# Patient Record
Sex: Female | Born: 1945
Health system: Southern US, Community
[De-identification: ages and names within clinical notes are randomized; demographics above are authoritative.]

## PROBLEM LIST (undated history)

## (undated) DIAGNOSIS — I10 Essential (primary) hypertension: Secondary | ICD-10-CM

## (undated) DIAGNOSIS — H269 Unspecified cataract: Secondary | ICD-10-CM

## (undated) HISTORY — PX: ABDOMINAL HYSTERECTOMY: SHX81

## (undated) HISTORY — DX: Unspecified cataract: H26.9

---

## 2011-12-27 ENCOUNTER — Encounter (HOSPITAL_COMMUNITY): Payer: Self-pay | Admitting: Emergency Medicine

## 2011-12-27 ENCOUNTER — Emergency Department (HOSPITAL_COMMUNITY)
Admission: EM | Admit: 2011-12-27 | Discharge: 2011-12-27 | Disposition: A | Discharge status: Home / Self Care | Payer: Medicare Other | Attending: Emergency Medicine | Admitting: Emergency Medicine

## 2011-12-27 ENCOUNTER — Emergency Department (HOSPITAL_COMMUNITY): Payer: Medicare Other

## 2011-12-27 DIAGNOSIS — R51 Headache: Secondary | ICD-10-CM | POA: Insufficient documentation

## 2011-12-27 DIAGNOSIS — R05 Cough: Secondary | ICD-10-CM | POA: Insufficient documentation

## 2011-12-27 DIAGNOSIS — I10 Essential (primary) hypertension: Secondary | ICD-10-CM | POA: Insufficient documentation

## 2011-12-27 DIAGNOSIS — R059 Cough, unspecified: Secondary | ICD-10-CM | POA: Insufficient documentation

## 2011-12-27 HISTORY — DX: Essential (primary) hypertension: I10

## 2011-12-27 LAB — POCT I-STAT, CHEM 8
BUN: 10 mg/dL (ref 6–23)
Calcium, Ion: 1.12 mmol/L (ref 1.12–1.32)
Chloride: 102 mEq/L (ref 96–112)
Creatinine, Ser: 0.6 mg/dL (ref 0.50–1.10)
Glucose, Bld: 137 mg/dL — ABNORMAL HIGH (ref 70–99)
HCT: 38 % (ref 36.0–46.0)
Hemoglobin: 12.9 g/dL (ref 12.0–15.0)
Potassium: 3.1 mEq/L — ABNORMAL LOW (ref 3.5–5.1)
Sodium: 143 mEq/L (ref 135–145)
TCO2: 31 mmol/L (ref 0–100)

## 2011-12-27 LAB — SEDIMENTATION RATE: Sed Rate: 25 mm/hr — ABNORMAL HIGH (ref 0–22)

## 2011-12-27 NOTE — Discharge Instructions (Signed)
FOLLOW UP WITH YOUR DOCTOR THIS WEEK FOR RECHECK OF BOTH HEADACHE AS WELL AS BLOOD PRESSURE. RETURN HERE WITH ANY WORSENING SYMPTOMS OR NEW CONCERN.   Headache, General, Unknown Cause The specific cause of your headache may not have been found today. There are many causes and types of headache. A few common ones are:  Tension headache.   Migraine.   Infections (examples: dental and sinus infections).   Bone and/or joint problems in the neck or jaw.   Depression.   Eye problems.  These headaches are not life threatening.  Headaches can sometimes be diagnosed by a patient history and a physical exam. Sometimes, lab and imaging studies (such as x-ray and/or CT scan) are used to rule out more serious problems. In some cases, a spinal tap (lumbar puncture) may be requested. There are many times when your exam and tests may be normal on the first visit even when there is a serious problem causing your headaches. Because of that, it is very important to follow up with your doctor or local clinic for further evaluation. FINDING OUT THE RESULTS OF TESTS  If a radiology test was performed, a radiologist will review your results.   You will be contacted by the emergency department or your physician if any test results require a change in your treatment plan.   Not all test results may be available during your visit. If your test results are not back during the visit, make an appointment with your caregiver to find out the results. Do not assume everything is normal if you have not heard from your caregiver or the medical facility. It is important for you to follow up on all of your test results.  HOME CARE INSTRUCTIONS   Keep follow-up appointments with your caregiver, or any specialist referral.   Only take over-the-counter or prescription medicines for pain, discomfort, or fever as directed by your caregiver.   Biofeedback, massage, or other relaxation techniques may be helpful.   Ice packs  or heat applied to the head and neck can be used. Do this three to four times per day, or as needed.   Call your doctor if you have any questions or concerns.   If you smoke, you should quit.  SEEK MEDICAL CARE IF:   You develop problems with medications prescribed.   You do not respond to or obtain relief from medications.   You have a change from the usual headache.   You develop nausea or vomiting.  SEEK IMMEDIATE MEDICAL CARE IF:   If your headache becomes severe.   You have an unexplained oral temperature above 102 F (38.9 C), or as your caregiver suggests.   You have a stiff neck.   You have loss of vision.   You have muscular weakness.   You have loss of muscular control.   You develop severe symptoms different from your first symptoms.   You start losing your balance or have trouble walking.   You feel faint or pass out.  MAKE SURE YOU:   Understand these instructions.   Will watch your condition.   Will get help right away if you are not doing well or get worse.  Document Released: 10/05/2005 Document Revised: 09/24/2011 Document Reviewed: 05/24/2008 [facility] Patient Information 2012 [place], [place].

## 2011-12-27 NOTE — ED Notes (Signed)
Pt in CT scan.

## 2011-12-27 NOTE — ED Notes (Signed)
Heart Healthy Tray Ordered  

## 2011-12-27 NOTE — ED Provider Notes (Signed)
Medical screening examination/treatment/procedure(s) were conducted as a shared visit with non-physician practitioner(s) and myself.  I personally evaluated the patient during the encounter  Joellyn Grandt, MD 12/27/11 1943 

## 2011-12-27 NOTE — ED Notes (Signed)
PT. REPORTS HEADACHE ONSET LAST NIGHT , ALSO REPORTS OCCASIONAL PRODUCTIVE COUGH FOR 1 WEEK.

## 2011-12-27 NOTE — ED Provider Notes (Signed)
History     CSN: [id number]  Arrival date & time 12/27/11  9629   First MD Initiated Contact with Patient 12/27/11 [phone]      Chief Complaint  Patient presents with  . Headache    (Consider location/radiation/quality/duration/timing/severity/associated sxs/prior treatment) HPI Complains of frontal headache sharp and dull in nature gradual in onset, intermittent 2 nights ago pain lasts 10 minutes at a time nonradiating treated with Tylenol with partial relief. Has never had similar symptoms in the past. No other associated symptoms no visual change no fever. Patient reports his had slight cough for one week which is improving with time. Patient pain-free at present Past Medical History  Diagnosis Date  . Hypertension     Past Surgical History  Procedure Date  . Abdominal hysterectomy     No family history on file.  History  Substance Use Topics  . Smoking status: Never Smoker   . Smokeless tobacco: Not on file  . Alcohol Use: No    OB History    Grav Para Term Preterm Abortions TAB SAB Ect Mult Living                  Review of Systems  Constitutional: Negative.   Respiratory: Positive for cough.   Cardiovascular: Negative.   Gastrointestinal: Negative.   Musculoskeletal: Negative.   Skin: Negative.   Neurological: Positive for headaches.  Hematological: Negative.   Psychiatric/Behavioral: Negative.   All other systems reviewed and are negative.    Allergies  Review of patient's allergies indicates no known allergies.  Home Medications   Current Outpatient Rx  Name Route Sig Dispense Refill  . ADULT MULTIVITAMIN W/MINERALS CH Oral Take 1 tablet by mouth daily.      BP 181/90  Pulse 74  Temp(Src) 98.3 F (36.8 C) (Oral)  Resp 20  SpO2 96%  Physical Exam  Nursing note and vitals reviewed. Constitutional: She appears well-developed and well-nourished.  HENT:  Head: Normocephalic and atraumatic.  Eyes: Conjunctivae are normal. Pupils are equal,  round, and reactive to light.       Fundi benign  Neck: Neck supple. No tracheal deviation present. No thyromegaly present.  Cardiovascular: Normal rate and regular rhythm.   No murmur heard. Pulmonary/Chest: Effort normal and breath sounds normal.  Abdominal: Soft. Bowel sounds are normal. She exhibits no distension. There is no tenderness.  Musculoskeletal: Normal range of motion. She exhibits no edema and no tenderness.  Neurological: She is alert. She has normal reflexes. Coordination normal.  Skin: Skin is warm and dry. No rash noted.  Psychiatric: She has a normal mood and affect. Her behavior is normal. Thought content normal.    ED Course  Procedures (including critical care time)  Labs Reviewed - No data to display No results found.   No diagnosis found. Declines pain medicine   MDM  Pretest probability for temporal arteritis is low He consented CDU pending laboratory results Diagnosis headache        Doug Sou, MD 12/27/11 [phone]

## 2011-12-27 NOTE — ED Provider Notes (Signed)
She has been pain free while waiting CT and lab results. Currently eating breakfast. CT negative, Sed Rate WNL. Discussed findings with patient and husband, all questions answered. Plan to discharge home with PCP follow up.  Rodena Medin, PA-C 12/27/11 1023

## 2013-03-23 IMAGING — CT CT HEAD W/O CM
1 of 2 series · 16 of 30 positions shown, 20 images · non-contrast
Comparison: None.

CLINICAL DATA: Headache.

CT HEAD WITHOUT CONTRAST
TECHNIQUE: Contiguous axial images were obtained from the base of
the skull through the vertex without contrast.

[Series 3: recon 2: brain · axial · 0.47mm/px · z∈[+110,+251]mm · 16 of 64 slices shown, 20 images]
[im 4/64  brain]
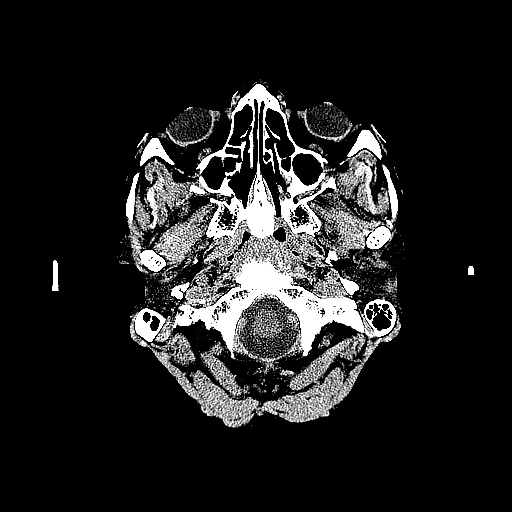
[im 4/64  bone]
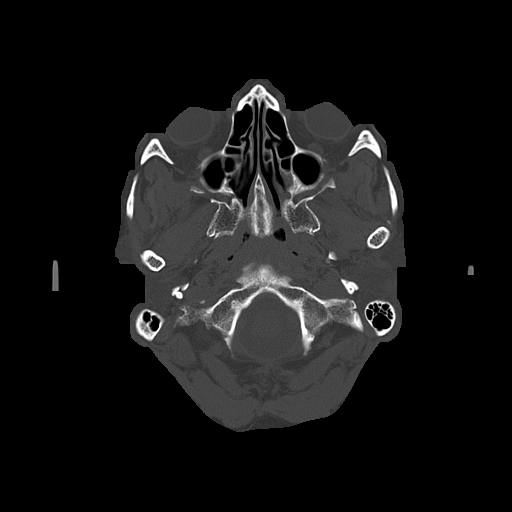
[im 7/64  brain]
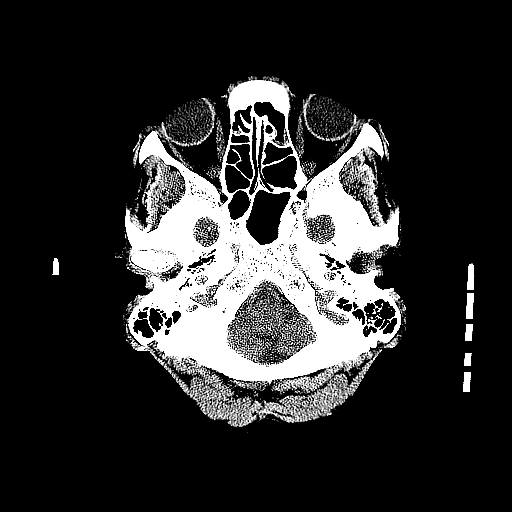
[im 10/64  brain]
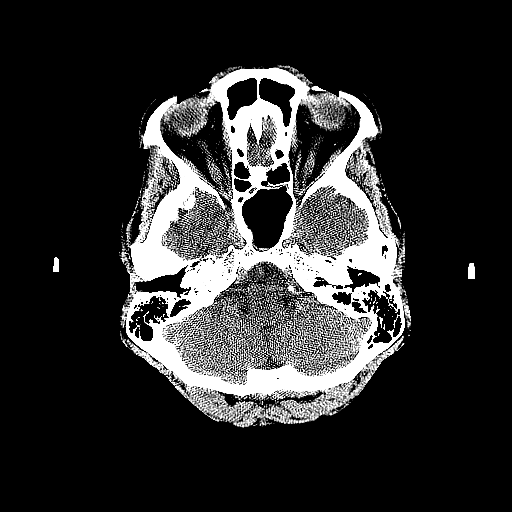
[im 14/64  brain]
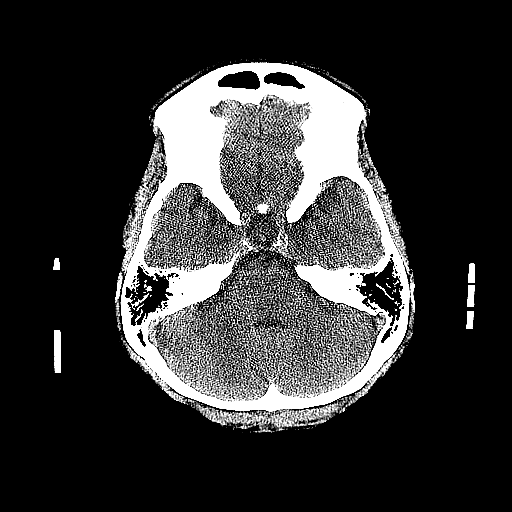
[im 20/64  brain]
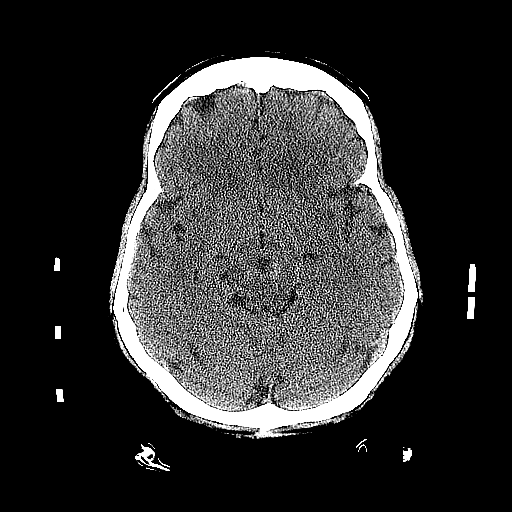
[im 20/64  bone]
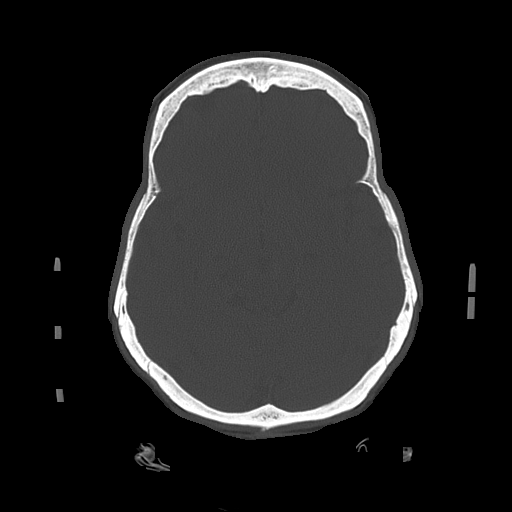
[im 24/64  brain]
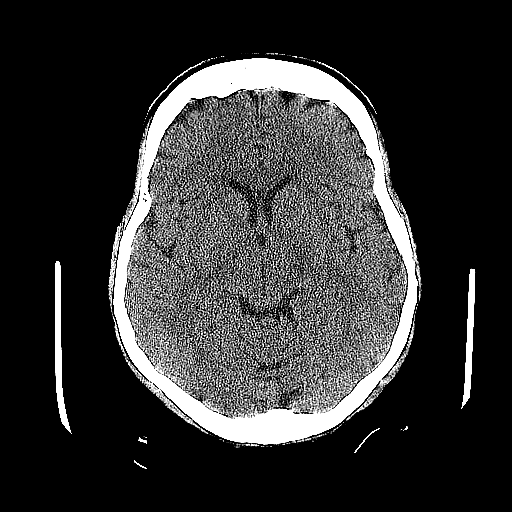
[im 27/64  brain]
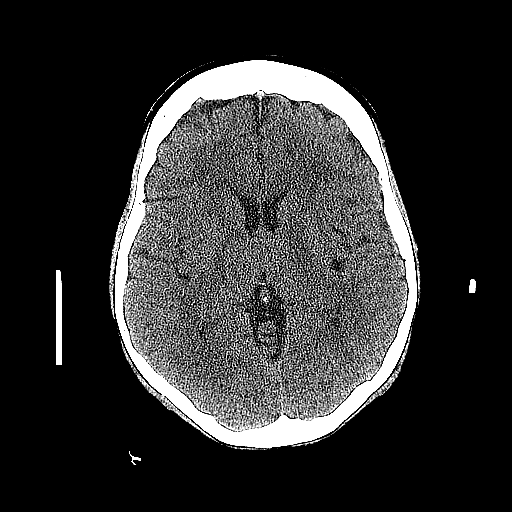
[im 30/64  brain]
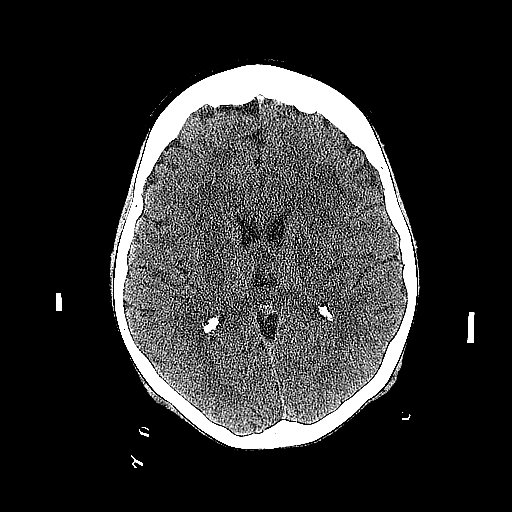
[im 34/64  brain]
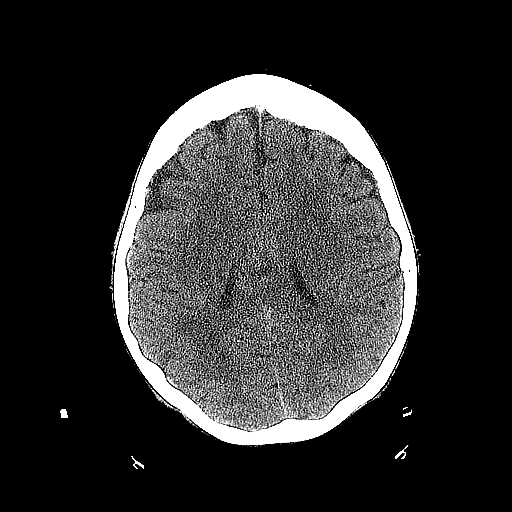
[im 34/64  bone]
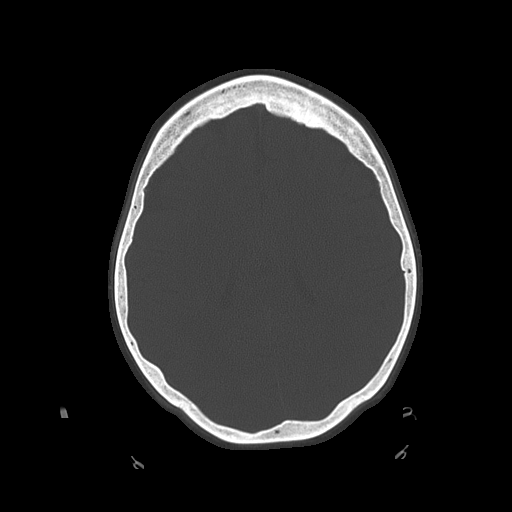
[im 37/64  brain]
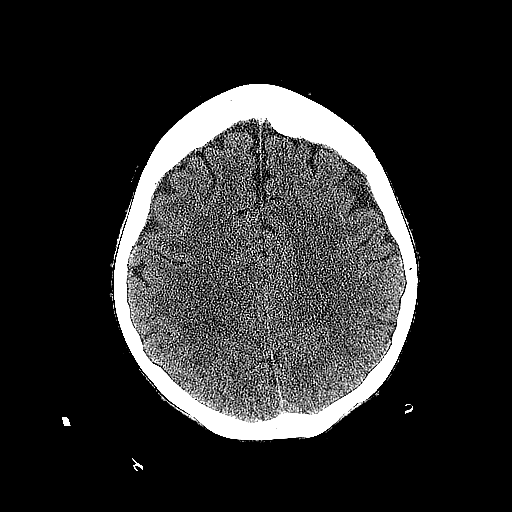
[im 40/64  brain]
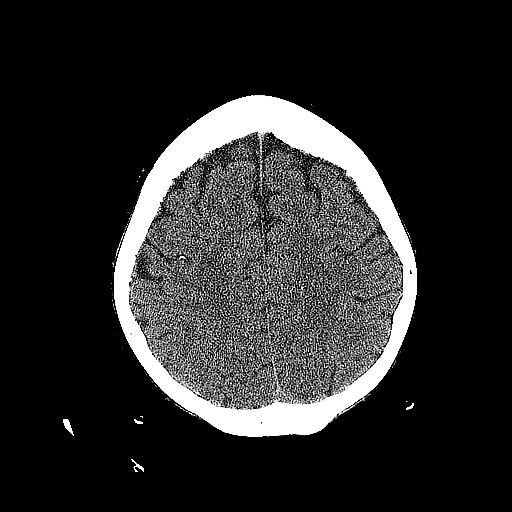
[im 44/64  brain]
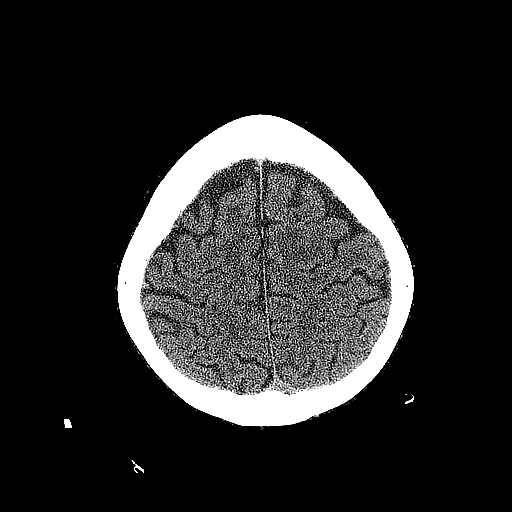
[im 50/64  brain]
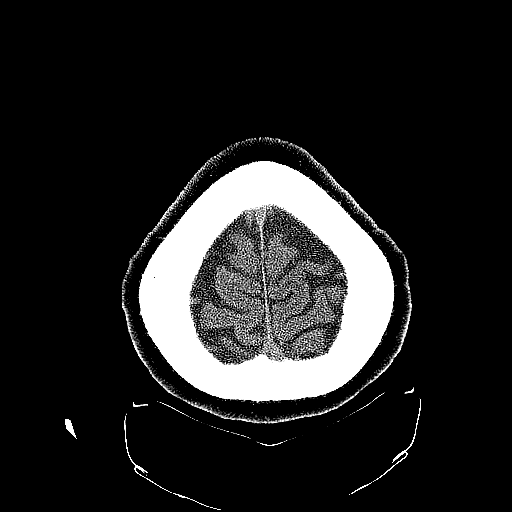
[im 50/64  bone]
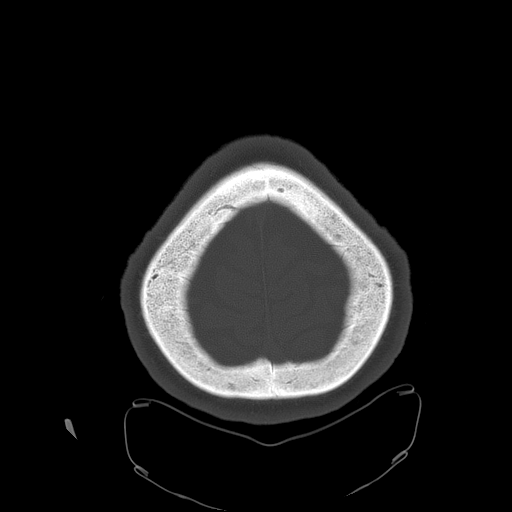
[im 54/64  brain]
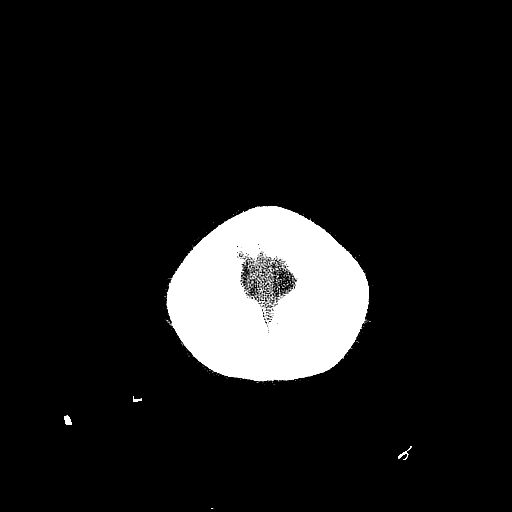
[im 57/64  brain]
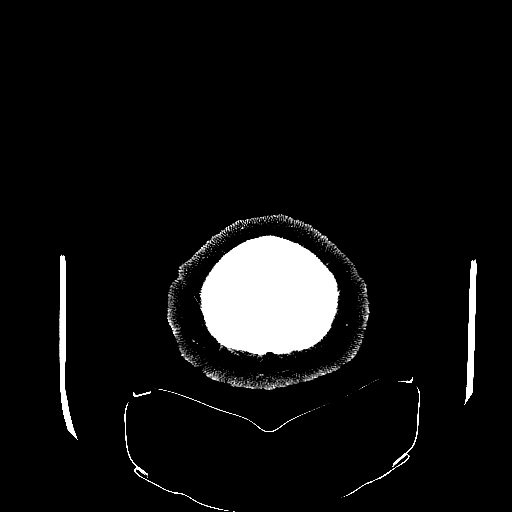
[im 60/64  brain]
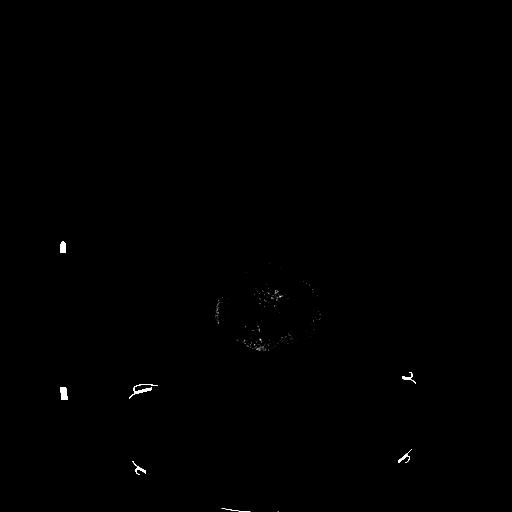

[16 of 30 positions shown; findings below may reference images not displayed]

FINDINGS: The brain demonstrates no evidence of hemorrhage,
infarction, edema, mass effect, extra-axial fluid collection,
hydrocephalus or mass lesion.  The skull is unremarkable.
Symmetric changes in the periventricular white matter are
consistent with small vessel disease.
IMPRESSION: Small vessel disease.  No acute findings.

## 2018-12-06 DIAGNOSIS — H35033 Hypertensive retinopathy, bilateral: Secondary | ICD-10-CM | POA: Diagnosis not present

## 2018-12-06 DIAGNOSIS — H2513 Age-related nuclear cataract, bilateral: Secondary | ICD-10-CM | POA: Diagnosis not present

## 2019-01-04 DIAGNOSIS — H5703 Miosis: Secondary | ICD-10-CM | POA: Diagnosis not present

## 2019-01-04 DIAGNOSIS — H35033 Hypertensive retinopathy, bilateral: Secondary | ICD-10-CM | POA: Diagnosis not present

## 2019-01-04 DIAGNOSIS — H2512 Age-related nuclear cataract, left eye: Secondary | ICD-10-CM | POA: Diagnosis not present

## 2019-01-04 DIAGNOSIS — H2513 Age-related nuclear cataract, bilateral: Secondary | ICD-10-CM | POA: Diagnosis not present

## 2019-01-04 DIAGNOSIS — H25013 Cortical age-related cataract, bilateral: Secondary | ICD-10-CM | POA: Diagnosis not present

## 2019-01-04 DIAGNOSIS — H25012 Cortical age-related cataract, left eye: Secondary | ICD-10-CM | POA: Diagnosis not present

## 2019-03-01 ENCOUNTER — Other Ambulatory Visit: Payer: Self-pay

## 2019-03-01 ENCOUNTER — Ambulatory Visit (INDEPENDENT_AMBULATORY_CARE_PROVIDER_SITE_OTHER): Payer: Medicare Other | Admitting: Family Medicine

## 2019-03-01 ENCOUNTER — Encounter: Payer: Self-pay | Admitting: Family Medicine

## 2019-03-01 DIAGNOSIS — Z114 Encounter for screening for human immunodeficiency virus [HIV]: Secondary | ICD-10-CM

## 2019-03-01 DIAGNOSIS — H259 Unspecified age-related cataract: Secondary | ICD-10-CM | POA: Diagnosis not present

## 2019-03-01 DIAGNOSIS — Z1159 Encounter for screening for other viral diseases: Secondary | ICD-10-CM

## 2019-03-01 DIAGNOSIS — H269 Unspecified cataract: Secondary | ICD-10-CM | POA: Insufficient documentation

## 2019-03-01 DIAGNOSIS — I10 Essential (primary) hypertension: Secondary | ICD-10-CM

## 2019-03-01 MED ORDER — VALSARTAN-HYDROCHLOROTHIAZIDE 80-12.5 MG PO TABS: 1.0000 | ORAL_TABLET | Freq: Every day | ORAL | 1 refills | Status: DC

## 2019-03-01 NOTE — Patient Instructions (Signed)
DASH Eating Plan  DASH stands for "Dietary Approaches to Stop Hypertension." The DASH eating plan is a healthy eating plan that has been shown to reduce high blood pressure (hypertension). It may also reduce your risk for type 2 diabetes, heart disease, and stroke. The DASH eating plan may also help with weight loss.  What are tips for following this plan?    General guidelines   Avoid eating more than 2,300 mg (milligrams) of salt (sodium) a day. If you have hypertension, you may need to reduce your sodium intake to 1,500 mg a day.   Limit alcohol intake to no more than 1 drink a day for nonpregnant women and 2 drinks a day for men. One drink equals 12 oz of beer, 5 oz of wine, or 1 oz of hard liquor.   Work with your health care provider to maintain a healthy body weight or to lose weight. Ask what an ideal weight is for you.   Get at least 30 minutes of exercise that causes your heart to beat faster (aerobic exercise) most days of the week. Activities may include walking, swimming, or biking.   Work with your health care provider or diet and nutrition specialist (dietitian) to adjust your eating plan to your individual calorie needs.  Reading food labels     Check food labels for the amount of sodium per serving. Choose foods with less than 5 percent of the Daily Value of sodium. Generally, foods with less than 300 mg of sodium per serving fit into this eating plan.   To find whole grains, look for the word "whole" as the first word in the ingredient list.  Shopping   Buy products labeled as "low-sodium" or "no salt added."   Buy fresh foods. Avoid canned foods and premade or frozen meals.  Cooking   Avoid adding salt when cooking. Use salt-free seasonings or herbs instead of table salt or sea salt. Check with your health care provider or pharmacist before using salt substitutes.   Do not fry foods. Cook foods using healthy methods such as baking, boiling, grilling, and broiling instead.   Cook with  heart-healthy oils, such as olive, canola, soybean, or sunflower oil.  Meal planning   Eat a balanced diet that includes:  ? 5 or more servings of fruits and vegetables each day. At each meal, try to fill half of your plate with fruits and vegetables.  ? Up to 6-8 servings of whole grains each day.  ? Less than 6 oz of lean meat, poultry, or fish each day. A 3-oz serving of meat is about the same size as a deck of cards. One egg equals 1 oz.  ? 2 servings of low-fat dairy each day.  ? A serving of nuts, seeds, or beans 5 times each week.  ? Heart-healthy fats. Healthy fats called Omega-3 fatty acids are found in foods such as flaxseeds and coldwater fish, like sardines, salmon, and mackerel.   Limit how much you eat of the following:  ? Canned or prepackaged foods.  ? Food that is high in trans fat, such as fried foods.  ? Food that is high in saturated fat, such as fatty meat.  ? Sweets, desserts, sugary drinks, and other foods with added sugar.  ? Full-fat dairy products.   Do not salt foods before eating.   Try to eat at least 2 vegetarian meals each week.   Eat more home-cooked food and less restaurant, buffet, and fast food.     When eating at a restaurant, ask that your food be prepared with less salt or no salt, if possible.  What foods are recommended?  The items listed may not be a complete list. Talk with your dietitian about what dietary choices are best for you.  Grains  Whole-grain or whole-wheat bread. Whole-grain or whole-wheat pasta. Brown rice. Oatmeal. Quinoa. Bulgur. Whole-grain and low-sodium cereals. Pita bread. Low-fat, low-sodium crackers. Whole-wheat flour tortillas.  Vegetables  Fresh or frozen vegetables (raw, steamed, roasted, or grilled). Low-sodium or reduced-sodium tomato and vegetable juice. Low-sodium or reduced-sodium tomato sauce and tomato paste. Low-sodium or reduced-sodium canned vegetables.  Fruits  All fresh, dried, or frozen fruit. Canned fruit in natural juice (without  added sugar).  Meat and other protein foods  Skinless chicken or turkey. Ground chicken or turkey. Pork with fat trimmed off. Fish and seafood. Egg whites. Dried beans, peas, or lentils. Unsalted nuts, nut butters, and seeds. Unsalted canned beans. Lean cuts of beef with fat trimmed off. Low-sodium, lean deli meat.  Dairy  Low-fat (1%) or fat-free (skim) milk. Fat-free, low-fat, or reduced-fat cheeses. Nonfat, low-sodium ricotta or cottage cheese. Low-fat or nonfat yogurt. Low-fat, low-sodium cheese.  Fats and oils  Soft margarine without trans fats. Vegetable oil. Low-fat, reduced-fat, or light mayonnaise and salad dressings (reduced-sodium). Canola, safflower, olive, soybean, and sunflower oils. Avocado.  Seasoning and other foods  Herbs. Spices. Seasoning mixes without salt. Unsalted popcorn and pretzels. Fat-free sweets.  What foods are not recommended?  The items listed may not be a complete list. Talk with your dietitian about what dietary choices are best for you.  Grains  Baked goods made with fat, such as croissants, muffins, or some breads. Dry pasta or rice meal packs.  Vegetables  Creamed or fried vegetables. Vegetables in a cheese sauce. Regular canned vegetables (not low-sodium or reduced-sodium). Regular canned tomato sauce and paste (not low-sodium or reduced-sodium). Regular tomato and vegetable juice (not low-sodium or reduced-sodium). Pickles. Olives.  Fruits  Canned fruit in a light or heavy syrup. Fried fruit. Fruit in cream or butter sauce.  Meat and other protein foods  Fatty cuts of meat. Ribs. Fried meat. Bacon. Sausage. Bologna and other processed lunch meats. Salami. Fatback. Hotdogs. Bratwurst. Salted nuts and seeds. Canned beans with added salt. Canned or smoked fish. Whole eggs or egg yolks. Chicken or turkey with skin.  Dairy  Whole or 2% milk, cream, and half-and-half. Whole or full-fat cream cheese. Whole-fat or sweetened yogurt. Full-fat cheese. Nondairy creamers. Whipped toppings.  Processed cheese and cheese spreads.  Fats and oils  Butter. Stick margarine. Lard. Shortening. Ghee. Bacon fat. Tropical oils, such as coconut, palm kernel, or palm oil.  Seasoning and other foods  Salted popcorn and pretzels. Onion salt, garlic salt, seasoned salt, table salt, and sea salt. Worcestershire sauce. Tartar sauce. Barbecue sauce. Teriyaki sauce. Soy sauce, including reduced-sodium. Steak sauce. Canned and packaged gravies. Fish sauce. Oyster sauce. Cocktail sauce. Horseradish that you find on the shelf. Ketchup. Mustard. Meat flavorings and tenderizers. Bouillon cubes. Hot sauce and Tabasco sauce. Premade or packaged marinades. Premade or packaged taco seasonings. Relishes. Regular salad dressings.  Where to find more information:   National Heart, Lung, and Blood Institute: www.nhlbi.nih.gov   American Heart Association: www.heart.org  Summary   The DASH eating plan is a healthy eating plan that has been shown to reduce high blood pressure (hypertension). It may also reduce your risk for type 2 diabetes, heart disease, and stroke.   With the   DASH eating plan, you should limit salt (sodium) intake to 2,300 mg a day. If you have hypertension, you may need to reduce your sodium intake to 1,500 mg a day.   When on the DASH eating plan, aim to eat more fresh fruits and vegetables, whole grains, lean proteins, low-fat dairy, and heart-healthy fats.   Work with your health care provider or diet and nutrition specialist (dietitian) to adjust your eating plan to your individual calorie needs.  This information is not intended to replace advice given to you by your health care provider. Make sure you discuss any questions you have with your health care provider.  Document Released: 09/24/2011 Document Revised: 09/28/2016 Document Reviewed: 09/28/2016  Elsevier Interactive Patient Education  2019 [employer]

## 2019-03-01 NOTE — Progress Notes (Signed)
Name: Jeanne Hickman   MRN: [id number]    DOB: 02/12/1946   Date:03/01/2019       Progress Note  Subjective  Chief Complaint  Chief Complaint  Patient presents with  . Establish Care  . Hypertension    elevated BP counld not have cataract surgery    I connected with  Jeanne Hickman  on 03/01/19 at  7:40 AM EDT by a video enabled telemedicine application and verified that I am speaking with the correct person using two identifiers.  I discussed the limitations of evaluation and management by telemedicine and the availability of in person appointments. The patient expressed understanding and agreed to proceed. Staff also discussed with the patient that there may be a patient responsible charge related to this service. Patient Location: Home Provider Location: Home Additional Individuals present: None  HPI  She presents to establish care and for the following:  Social: Lives with her husband; has two sons in [place].    HTN: She was told yesterday that she was unable to have her cataract surgery. 239/110 initially, then went down to 220, then 211.  - She used to be prescribed valsartan-HCTZ, but ran out about 2 years ago when her PCP retired.  She used to see Jeanne Hickman. -  no TIAs, no chest pain on exertion, no dyspnea on exertion, no swelling of ankles, headches, vision changes, confusion, slurred speech, or weakness. - DASH diet discussed - pt does follow a low sodium diet; salt not added to cooking and salt shaker not on table  Cataracts LEFT: Jeanne Hickman was supposed to remove this yesterday, but patient was too hypertensive. She does wear glasses all the time.  There are no active problems to display for this patient.   Past Surgical History:  Procedure Laterality Date  . ABDOMINAL HYSTERECTOMY      No family history on file.  Social History   Socioeconomic History  . Marital status: Married    Spouse name: Jeanne Hickman  . Number of children:  2  . Years of education: Not on file  . Highest education level: Not on file  Occupational History  . Occupation: retired  Scientific laboratory technician  . Financial resource strain: Not hard at all  . Food insecurity:    Worry: Never true    Inability: Never true  . Transportation needs:    Medical: No    Non-medical: No  Tobacco Use  . Smoking status: Former Smoker    Types: Cigarettes    Last attempt to quit: 02/28/1978    Years since quitting: 41.0  . Smokeless tobacco: Never Used  Substance and Sexual Activity  . Alcohol use: No  . Drug use: No  . Sexual activity: Yes    Partners: Male  Lifestyle  . Physical activity:    Days per week: 2 days    Minutes per session: 60 min  . Stress: Not at all  Relationships  . Social connections:    Talks on phone: More than three times a week    Gets together: More than three times a week    Attends religious service: 1 to 4 times per year    Active member of club or organization: No    Attends meetings of clubs or organizations: Never    Relationship status: Married  . Intimate partner violence:    Fear of current or ex partner: No    Emotionally abused: No    Physically abused: No  Forced sexual activity: No  Other Topics Concern  . Not on file  Social History Narrative   Last seen JeanneEason 2 years ago     Current Outpatient Medications:  Marland Kitchen  Multiple Vitamin (MULITIVITAMIN WITH MINERALS) TABS, Take 1 tablet by mouth daily., Disp: , Rfl:  .  valsartan-hydrochlorothiazide (DIOVAN-HCT) 160-25 MG tablet, Take 1 tablet by mouth daily., Disp: , Rfl:   No Known Allergies  I personally reviewed active problem list, medication list, allergies, health maintenance, notes from last encounter, lab results with the patient/caregiver today.   ROS Constitutional: Negative for fever or weight change.  Respiratory: Negative for cough and shortness of breath.   Cardiovascular: Negative for chest pain or palpitations.  Gastrointestinal: Negative  for abdominal pain, no bowel changes.  Musculoskeletal: Negative for gait problem or joint swelling.  Skin: Negative for rash.  Neurological: Negative for dizziness or headache.  No other specific complaints in a complete review of systems (except as listed in HPI above).  Objective  Virtual encounter, vitals not obtained.  There is no height or weight on file to calculate BMI.  Physical Exam Constitutional: Patient appears well-developed and well-nourished. No distress.  HENT: Head: Normocephalic and atraumatic.  Neck: Normal range of motion. Pulmonary/Chest: Effort normal. No respiratory distress. Speaking in complete sentences Neurological: Pt is alert and oriented to person, place, and time. Coordination, speech and gait are normal.  Psychiatric: Patient has a very pleasant mood and affect. behavior is normal. Judgment and thought content normal.  No results found for this or any previous visit (from the past 72 hour(s)).  PHQ2/9: Depression screen PHQ 2/9 03/01/2019  Decreased Interest 0  Down, Depressed, Hopeless 0  PHQ - 2 Score 0  Altered sleeping 0  Tired, decreased energy 0  Change in appetite 0  Feeling bad or failure about yourself  0  Trouble concentrating 0  Moving slowly or fidgety/restless 0  Suicidal thoughts 0  PHQ-9 Score 0  Difficult doing work/chores Not difficult at all   PHQ-2/9 Result is negative.    Fall Risk: Fall Risk  03/01/2019  Falls in the past year? 0  Number falls in past yr: 0  Injury with Fall? 0  Follow up Falls evaluation completed    Assessment & Plan  1. Essential hypertension - Has likely been poorly controlled for about 2 years, we will restart her previous medication at 1/2 dose for 2 days, then plan to increase to full dose with close monitoring until under control.  Will obtain labs at f/u in 2 days to ensure proper kidney function and to ensure no other underlying cause is suspected (ie adrenal adenoma, hyperthyroid, kidney  disease) - valsartan-hydrochlorothiazide (DIOVAN HCT) 80-12.5 MG tablet; Take 1 tablet by mouth daily.  Dispense: 30 tablet; Refill: 1 - COMPLETE METABOLIC PANEL WITH GFR - Lipid panel - TSH - CBC w/Diff/Platelet  2. Senile cataract of left eye, unspecified age-related cataract type - Will work to reduce BP to allow for surgery  3. Encounter for screening for HIV - HIV Antibody (routine testing w rflx)  4. Need for hepatitis C screening test - Hepatitis C antibody  Will address her remaining outstanding health maintenance items at 2 day follow up.    I discussed the assessment and treatment plan with the patient. The patient was provided an opportunity to ask questions and all were answered. The patient agreed with the plan and demonstrated an understanding of the instructions.  The patient was advised to call back  or seek an in-person evaluation if the symptoms worsen or if the condition fails to improve as anticipated.  I provided 22 minutes of non-face-to-face time during this encounter.

## 2019-03-03 ENCOUNTER — Encounter: Payer: Self-pay | Admitting: Family Medicine

## 2019-03-03 ENCOUNTER — Ambulatory Visit (INDEPENDENT_AMBULATORY_CARE_PROVIDER_SITE_OTHER): Payer: Medicare Other | Admitting: Family Medicine

## 2019-03-03 ENCOUNTER — Other Ambulatory Visit: Payer: Self-pay

## 2019-03-03 VITALS — BP 210/88 | HR 86 | Temp 98.4°F | Resp 14 | Ht 66.0 in | Wt 174.2 lb

## 2019-03-03 DIAGNOSIS — Z1212 Encounter for screening for malignant neoplasm of rectum: Secondary | ICD-10-CM | POA: Diagnosis not present

## 2019-03-03 DIAGNOSIS — Z1231 Encounter for screening mammogram for malignant neoplasm of breast: Secondary | ICD-10-CM | POA: Diagnosis not present

## 2019-03-03 DIAGNOSIS — Z13820 Encounter for screening for osteoporosis: Secondary | ICD-10-CM | POA: Diagnosis not present

## 2019-03-03 DIAGNOSIS — Z23 Encounter for immunization: Secondary | ICD-10-CM | POA: Diagnosis not present

## 2019-03-03 DIAGNOSIS — I10 Essential (primary) hypertension: Secondary | ICD-10-CM

## 2019-03-03 DIAGNOSIS — Z1211 Encounter for screening for malignant neoplasm of colon: Secondary | ICD-10-CM | POA: Diagnosis not present

## 2019-03-03 DIAGNOSIS — Z114 Encounter for screening for human immunodeficiency virus [HIV]: Secondary | ICD-10-CM | POA: Diagnosis not present

## 2019-03-03 DIAGNOSIS — Z1159 Encounter for screening for other viral diseases: Secondary | ICD-10-CM | POA: Diagnosis not present

## 2019-03-03 MED ORDER — VALSARTAN-HYDROCHLOROTHIAZIDE 80-12.5 MG PO TABS: 2.0000 | ORAL_TABLET | Freq: Every day | ORAL | 1 refills | Status: DC

## 2019-03-03 NOTE — Progress Notes (Signed)
Name: Jeanne Hickman   MRN: [id number]    DOB: 1946-06-01   Date:03/03/2019       Progress Note  Subjective  Chief Complaint  Chief Complaint  Patient presents with  . Hypertension    2 day follow up 196/90 at home 211/92    HPI  HTN: She was told earlier this week that she was unable to have her cataract surgery due to HTN. 239/110 initially.  She brings a log from home over the last few days and BP's have run in the 210's/90's.  She did not take her medication this morning.  We will increase Valsartan HCTZ to 160-25mg  today as she is tolerating the 80/12.5 without issue. -  no TIAs, no chest pain on exertion, no dyspnea on exertion, no swelling of ankles, headches, vision changes, confusion, slurred speech, or weakness. - DASH diet discussed - pt does follow a low sodium diet; salt not added to cooking and salt shaker not on table.  Patient Active Problem List   Diagnosis Date Noted  . Cataract     Social History   Tobacco Use  . Smoking status: Former Smoker    Types: Cigarettes    Last attempt to quit: 02/28/1978    Years since quitting: 41.0  . Smokeless tobacco: Never Used  Substance Use Topics  . Alcohol use: No    Current Outpatient Medications:  Marland Kitchen  Multiple Vitamin (MULITIVITAMIN WITH MINERALS) TABS, Take 1 tablet by mouth daily., Disp: , Rfl:  .  valsartan-hydrochlorothiazide (DIOVAN HCT) 80-12.5 MG tablet, Take 1 tablet by mouth daily., Disp: 30 tablet, Rfl: 1  No Known Allergies  I personally reviewed active problem list, medication list, allergies, health maintenance, notes from last encounter, lab results with the patient/caregiver today.  ROS  Constitutional: Negative for fever or weight change.  Respiratory: Negative for cough and shortness of breath.   Cardiovascular: Negative for chest pain or palpitations.  Gastrointestinal: Negative for abdominal pain, no bowel changes.  Musculoskeletal: Negative for gait problem or joint swelling.  Skin:  Negative for rash.  Neurological: Negative for dizziness or headache.  No other specific complaints in a complete review of systems (except as listed in HPI above).  Objective  Vitals:   03/03/19 0829  BP: (!) 210/94  Pulse: 86  Resp: 14  Temp: 98.4 F (36.9 C)  TempSrc: Oral  SpO2: 95%  Weight: 174 lb 3.2 oz (79 kg)  Height: 5\' 6"  (1.676 m)   Body mass index is 28.12 kg/m.  Nursing Note and Vital Signs reviewed.  Physical Exam  Constitutional: Patient appears well-developed and well-nourished. No distress.  HENT: Head: Normocephalic and atraumatic. Ears: bilateral TMs with no erythema or effusion; Nose: Nose normal. Mouth/Throat: Oropharynx is clear and moist. No oropharyngeal exudate or tonsillar swelling.  Eyes: Conjunctivae and EOM are normal. No scleral icterus.  Pupils are equal, round, and reactive to light.  Neck: Normal range of motion. Neck supple. No JVD present. No thyromegaly present.  Cardiovascular: Normal rate, regular rhythm and normal heart sounds.  No murmur heard. No BLE edema. Pulmonary/Chest: Effort normal and breath sounds normal. No respiratory distress. Musculoskeletal: Normal range of motion, no joint effusions. No gross deformities Neurological: Pt is alert and oriented to person, place, and time. No cranial nerve deficit. Coordination, balance, strength, speech and gait are normal.  Skin: Skin is warm and dry. No rash noted. No erythema.  Psychiatric: Patient has a normal mood and affect. behavior is normal. Judgment and  thought content normal.  No results found for this or any previous visit (from the past 72 hour(s)).  Assessment & Plan  1. Essential hypertension - Labs today ordered at last visit. - valsartan-hydrochlorothiazide (DIOVAN HCT) 80-12.5 MG tablet; Take 2 tablets by mouth daily.  Dispense: 30 tablet; Refill: 1  2. Need for Tdap vaccination - Tdap vaccine greater than or equal to 7yo IM  3. Screening for colorectal cancer -  Ambulatory referral to Gastroenterology  4. Breast cancer screening by mammogram - MM 3D SCREEN BREAST BILATERAL; Future  5. Osteoporosis screening - DG Bone Density; Future  - Will get Pneumonia vaccine on Monday or in 2 weeks.  -Red flags and when to present for emergency care or RTC including fever >101.5F, chest pain, shortness of breath, new/worsening/un-resolving symptoms, reviewed with patient at time of visit. Follow up and care instructions discussed and provided in AVS.

## 2019-03-06 ENCOUNTER — Ambulatory Visit: Payer: Medicare Other

## 2019-03-06 ENCOUNTER — Other Ambulatory Visit: Payer: Self-pay | Admitting: Family Medicine

## 2019-03-06 ENCOUNTER — Other Ambulatory Visit: Payer: Self-pay

## 2019-03-06 ENCOUNTER — Telehealth: Payer: Self-pay | Admitting: Family Medicine

## 2019-03-06 ENCOUNTER — Telehealth: Payer: Self-pay

## 2019-03-06 ENCOUNTER — Other Ambulatory Visit (INDEPENDENT_AMBULATORY_CARE_PROVIDER_SITE_OTHER): Payer: Medicare Other

## 2019-03-06 DIAGNOSIS — R809 Proteinuria, unspecified: Secondary | ICD-10-CM

## 2019-03-06 DIAGNOSIS — I10 Essential (primary) hypertension: Secondary | ICD-10-CM

## 2019-03-06 DIAGNOSIS — E782 Mixed hyperlipidemia: Secondary | ICD-10-CM

## 2019-03-06 DIAGNOSIS — Z23 Encounter for immunization: Secondary | ICD-10-CM | POA: Diagnosis not present

## 2019-03-06 LAB — COMPLETE METABOLIC PANEL WITH GFR
AG Ratio: 1.3 (calc) (ref 1.0–2.5)
ALT: 16 U/L (ref 6–29)
AST: 16 U/L (ref 10–35)
Albumin: 4.2 g/dL (ref 3.6–5.1)
Alkaline phosphatase (APISO): 71 U/L (ref 37–153)
BUN: 23 mg/dL (ref 7–25)
CO2: 32 mmol/L (ref 20–32)
Calcium: 10.3 mg/dL (ref 8.6–10.4)
Chloride: 100 mmol/L (ref 98–110)
Creat: 0.87 mg/dL (ref 0.60–0.93)
GFR, Est African American: 77 mL/min/{1.73_m2} (ref >=60)
GFR, Est Non African American: 67 mL/min/{1.73_m2} (ref >=60)
Globulin: 3.3 g/dL (calc) (ref 1.9–3.7)
Glucose, Bld: 154 mg/dL — ABNORMAL HIGH (ref 65–99)
Potassium: 3.5 mmol/L (ref 3.5–5.3)
Sodium: 141 mmol/L (ref 135–146)
Total Bilirubin: 0.5 mg/dL (ref 0.2–1.2)
Total Protein: 7.5 g/dL (ref 6.1–8.1)

## 2019-03-06 LAB — CBC WITH DIFFERENTIAL/PLATELET
Absolute Monocytes: 502 cells/uL (ref 200–950)
Basophils Absolute: 50 cells/uL (ref 0–200)
Basophils Relative: 0.8 %
Eosinophils Absolute: 62 cells/uL (ref 15–500)
Eosinophils Relative: 1 %
HCT: 39.5 % (ref 35.0–45.0)
Hemoglobin: 12.8 g/dL (ref 11.7–15.5)
Lymphs Abs: 2108 cells/uL (ref 850–3900)
MCH: 26.6 pg — ABNORMAL LOW (ref 27.0–33.0)
MCHC: 32.4 g/dL (ref 32.0–36.0)
MCV: 82.1 fL (ref 80.0–100.0)
MPV: 10.9 fL (ref 7.5–12.5)
Monocytes Relative: 8.1 %
Neutro Abs: 3478 cells/uL (ref 1500–7800)
Neutrophils Relative %: 56.1 %
Platelets: 252 10*3/uL (ref 140–400)
RBC: 4.81 10*6/uL (ref 3.80–5.10)
RDW: 14 % (ref 11.0–15.0)
Total Lymphocyte: 34 %
WBC: 6.2 10*3/uL (ref 3.8–10.8)

## 2019-03-06 LAB — HIV ANTIBODY (ROUTINE TESTING W REFLEX): HIV 1&2 Ab, 4th Generation: NONREACTIVE

## 2019-03-06 LAB — LIPID PANEL
Cholesterol: 240 mg/dL — ABNORMAL HIGH (ref <200)
HDL: 59 mg/dL (ref >=50)
LDL Cholesterol (Calc): 155 mg/dL (calc) — ABNORMAL HIGH
Non-HDL Cholesterol (Calc): 181 mg/dL (calc) — ABNORMAL HIGH (ref <130)
Total CHOL/HDL Ratio: 4.1 (calc) (ref <5.0)
Triglycerides: 133 mg/dL (ref <150)

## 2019-03-06 LAB — TSH: TSH: 1.87 mIU/L (ref 0.40–4.50)

## 2019-03-06 LAB — MICROALBUMIN / CREATININE URINE RATIO
Creatinine, Urine: 102 mg/dL (ref 20–275)
Microalb Creat Ratio: 787 mcg/mg creat — ABNORMAL HIGH (ref <30)
Microalb, Ur: 80.3 mg/dL

## 2019-03-06 LAB — HEPATITIS C ANTIBODY
Hepatitis C Ab: NONREACTIVE
SIGNAL TO CUT-OFF: 0.04 (ref <1.00)

## 2019-03-06 MED ORDER — AMLODIPINE BESYLATE 5 MG PO TABS: 5.0000 mg | ORAL_TABLET | Freq: Every day | ORAL | 1 refills | Status: DC

## 2019-03-06 MED ORDER — ROSUVASTATIN CALCIUM 20 MG PO TABS: 20.0000 mg | ORAL_TABLET | Freq: Every day | ORAL | 3 refills | Status: DC

## 2019-03-06 NOTE — Telephone Encounter (Signed)
Sent in to [facility]. Please apologize for wrong pharmacy

## 2019-03-06 NOTE — Telephone Encounter (Signed)
Patient did car pull up for bp check.  Bp today is still high 212/120.  Consulted with Raquel Sarna NP and will amlodipine 5 mg at night and patient will return Friday for bp check

## 2019-03-06 NOTE — Telephone Encounter (Signed)
Pt.notified

## 2019-03-06 NOTE — Telephone Encounter (Signed)
Copied from [place] [id number]. Topic: Quick Communication - Rx Refill/Question >> Mar 06, 2019 11:57 AM Scherrie Gerlach wrote: Medication: amLODipine (NORVASC) 5 MG tablet rosuvastatin (CRESTOR) 20 MG tablet Pt states her husband is at [facility], but her scripts are not.  They were aent to wrong pharmacy. Can you resend to [facility] [place] [facility] [place] [phone] (Phone) [phone] (Fax)  (she states he is going to do a little shopping, and hopes they can be resent asap, she had appt this am)

## 2019-03-09 ENCOUNTER — Ambulatory Visit (INDEPENDENT_AMBULATORY_CARE_PROVIDER_SITE_OTHER): Payer: Medicare Other

## 2019-03-09 VITALS — BP 169/79 | Ht 66.0 in | Wt 174.0 lb

## 2019-03-09 DIAGNOSIS — Z0000 Encounter for general adult medical examination without abnormal findings: Secondary | ICD-10-CM

## 2019-03-09 NOTE — Patient Instructions (Addendum)
Jeanne Hickman , Thank you for taking time to come for your Medicare Wellness Visit. I appreciate your ongoing commitment to your health goals. Please review the following plan we discussed and let me know if I can assist you in the future.   Screening recommendations/referrals: Colonoscopy: Due, referral sent to GI on 03/03/19. They will contact you for appointment.  Mammogram: Please call [phone] to schedule your mammogram and bone density screening.  Recommended yearly ophthalmology/optometry visit for glaucoma screening and checkup Recommended yearly dental visit for hygiene and checkup  Vaccinations: Influenza vaccine: due during flu season Pneumococcal vaccine: done 03/06/19 Tdap vaccine: done 03/03/19 Shingles vaccine: Shingrix discussed. Please contact your pharmacy for coverage information.   Advanced directives: Advance directive discussed with you today. I have mailed a copy for you to complete at home and have notarized. Once this is complete please bring a copy in to our office so we can scan it into your chart.  Conditions/risks identified: Recommend healthy eating and physical activity for desired weight loss and physical activity.  Next appointment: 03/17/19 8:00 with Raelyn Ensign FNP   Preventive Care 73 Years and Older, Female Preventive care refers to lifestyle choices and visits with your health care provider that can promote health and wellness. What does preventive care include?  A yearly physical exam. This is also called an annual well check.  Dental exams once or twice a year.  Routine eye exams. Ask your health care provider how often you should have your eyes checked.  Personal lifestyle choices, including:  Daily care of your teeth and gums.  Regular physical activity.  Eating a healthy diet.  Avoiding tobacco and drug use.  Limiting alcohol use.  Practicing safe sex.  Taking low-dose aspirin every day.  Taking vitamin and mineral supplements as  recommended by your health care provider. What happens during an annual well check? The services and screenings done by your health care provider during your annual well check will depend on your age, overall health, lifestyle risk factors, and family history of disease. Counseling  Your health care provider may ask you questions about your:  Alcohol use.  Tobacco use.  Drug use.  Emotional well-being.  Home and relationship well-being.  Sexual activity.  Eating habits.  History of falls.  Memory and ability to understand (cognition).  Work and work Statistician.  Reproductive health. Screening  You may have the following tests or measurements:  Height, weight, and BMI.  Blood pressure.  Lipid and cholesterol levels. These may be checked every 5 years, or more frequently if you are over 29 years old.  Skin check.  Lung cancer screening. You may have this screening every year starting at age 73 if you have a 30-pack-year history of smoking and currently smoke or have quit within the past 15 years.  Fecal occult blood test (FOBT) of the stool. You may have this test every year starting at age 73.  Flexible sigmoidoscopy or colonoscopy. You may have a sigmoidoscopy every 5 years or a colonoscopy every 10 years starting at age 73.  Hepatitis C blood test.  Hepatitis B blood test.  Sexually transmitted disease (STD) testing.  Diabetes screening. This is done by checking your blood sugar (glucose) after you have not eaten for a while (fasting). You may have this done every 1-3 years.  Bone density scan. This is done to screen for osteoporosis. You may have this done starting at age 73.  Mammogram. This may be done every 1-2 years.  Talk to your health care provider about how often you should have regular mammograms. Talk with your health care provider about your test results, treatment options, and if necessary, the need for more tests. Vaccines  Your health care  provider may recommend certain vaccines, such as:  Influenza vaccine. This is recommended every year.  Tetanus, diphtheria, and acellular pertussis (Tdap, Td) vaccine. You may need a Td booster every 10 years.  Zoster vaccine. You may need this after age 23.  Pneumococcal 13-valent conjugate (PCV13) vaccine. One dose is recommended after age 73.  Pneumococcal polysaccharide (PPSV23) vaccine. One dose is recommended after age 50. Talk to your health care provider about which screenings and vaccines you need and how often you need them. This information is not intended to replace advice given to you by your health care provider. Make sure you discuss any questions you have with your health care provider. Document Released: 11/01/2015 Document Revised: 06/24/2016 Document Reviewed: 08/06/2015 Elsevier Interactive Patient Education  2017 [employer] Prevention in the Home Falls can cause injuries. They can happen to people of all ages. There are many things you can do to make your home safe and to help prevent falls. What can I do on the outside of my home?  Regularly fix the edges of walkways and driveways and fix any cracks.  Remove anything that might make you trip as you walk through a door, such as a raised step or threshold.  Trim any bushes or trees on the path to your home.  Use bright outdoor lighting.  Clear any walking paths of anything that might make someone trip, such as rocks or tools.  Regularly check to see if handrails are loose or broken. Make sure that both sides of any steps have handrails.  Any raised decks and porches should have guardrails on the edges.  Have any leaves, snow, or ice cleared regularly.  Use sand or salt on walking paths during winter.  Clean up any spills in your garage right away. This includes oil or grease spills. What can I do in the bathroom?  Use night lights.  Install grab bars by the toilet and in the tub and shower. Do  not use towel bars as grab bars.  Use non-skid mats or decals in the tub or shower.  If you need to sit down in the shower, use a plastic, non-slip stool.  Keep the floor dry. Clean up any water that spills on the floor as soon as it happens.  Remove soap buildup in the tub or shower regularly.  Attach bath mats securely with double-sided non-slip rug tape.  Do not have throw rugs and other things on the floor that can make you trip. What can I do in the bedroom?  Use night lights.  Make sure that you have a light by your bed that is easy to reach.  Do not use any sheets or blankets that are too big for your bed. They should not hang down onto the floor.  Have a firm chair that has side arms. You can use this for support while you get dressed.  Do not have throw rugs and other things on the floor that can make you trip. What can I do in the kitchen?  Clean up any spills right away.  Avoid walking on wet floors.  Keep items that you use a lot in easy-to-reach places.  If you need to reach something above you, use a strong step stool  that has a grab bar.  Keep electrical cords out of the way.  Do not use floor polish or wax that makes floors slippery. If you must use wax, use non-skid floor wax.  Do not have throw rugs and other things on the floor that can make you trip. What can I do with my stairs?  Do not leave any items on the stairs.  Make sure that there are handrails on both sides of the stairs and use them. Fix handrails that are broken or loose. Make sure that handrails are as long as the stairways.  Check any carpeting to make sure that it is firmly attached to the stairs. Fix any carpet that is loose or worn.  Avoid having throw rugs at the top or bottom of the stairs. If you do have throw rugs, attach them to the floor with carpet tape.  Make sure that you have a light switch at the top of the stairs and the bottom of the stairs. If you do not have them,  ask someone to add them for you. What else can I do to help prevent falls?  Wear shoes that:  Do not have high heels.  Have rubber bottoms.  Are comfortable and fit you well.  Are closed at the toe. Do not wear sandals.  If you use a stepladder:  Make sure that it is fully opened. Do not climb a closed stepladder.  Make sure that both sides of the stepladder are locked into place.  Ask someone to hold it for you, if possible.  Clearly mark and make sure that you can see:  Any grab bars or handrails.  First and last steps.  Where the edge of each step is.  Use tools that help you move around (mobility aids) if they are needed. These include:  Canes.  Walkers.  Scooters.  Crutches.  Turn on the lights when you go into a dark area. Replace any light bulbs as soon as they burn out.  Set up your furniture so you have a clear path. Avoid moving your furniture around.  If any of your floors are uneven, fix them.  If there are any pets around you, be aware of where they are.  Review your medicines with your doctor. Some medicines can make you feel dizzy. This can increase your chance of falling. Ask your doctor what other things that you can do to help prevent falls. This information is not intended to replace advice given to you by your health care provider. Make sure you discuss any questions you have with your health care provider. Document Released: 08/01/2009 Document Revised: 03/12/2016 Document Reviewed: 11/09/2014 Elsevier Interactive Patient Education  2017 [employer].

## 2019-03-09 NOTE — Progress Notes (Signed)
Subjective:   Jeanne Hickman is a 73 y.o. female who presents for an Initial Medicare Annual Wellness Visit.  Virtual Visit via Telephone Note  I connected with Jeanne Hickman on 03/09/19 at  3:30 PM EDT by telephone and verified that I am speaking with the correct person using two identifiers.  Medicare Annual Wellness visit completed telephonically due to Covid-19 pandemic.   Location: Patient: home Provider: office  I discussed the limitations, risks, security and privacy concerns of performing an evaluation and management service by telephone and the availability of in person appointments.The patient expressed understanding and agreed to proceed.  Some vital signs may be absent or patient reported.   Clemetine Marker, LPN    Review of Systems     Cardiac Risk Factors include: advanced age (>41men, >31 women);hypertension     Objective:    Today's Vitals   03/09/19 1540  Weight: 174 lb (78.9 kg)  Height: 5\' 6"  (1.676 m)   Body mass index is 28.08 kg/m.  Advanced Directives 03/09/2019  Does Patient Have a Medical Advance Directive? No  Would patient like information on creating a medical advance directive? Yes (MAU/Ambulatory/Procedural Areas - Information given)    Current Medications (verified) Outpatient Encounter Medications as of 03/09/2019  Medication Sig  . amLODipine (NORVASC) 5 MG tablet Take 1 tablet (5 mg total) by mouth daily.  . Multiple Vitamin (MULITIVITAMIN WITH MINERALS) TABS Take 1 tablet by mouth daily.  . rosuvastatin (CRESTOR) 20 MG tablet Take 1 tablet (20 mg total) by mouth daily.  . valsartan-hydrochlorothiazide (DIOVAN HCT) 80-12.5 MG tablet Take 2 tablets by mouth daily.   No facility-administered encounter medications on file as of 03/09/2019.     Allergies (verified) Patient has no known allergies.   History: Past Medical History:  Diagnosis Date  . Cataract   . Hypertension    Past Surgical History:  Procedure  Laterality Date  . ABDOMINAL HYSTERECTOMY     History reviewed. No pertinent family history. Social History   Socioeconomic History  . Marital status: Married    Spouse name: robert  . Number of children: 2  . Years of education: Not on file  . Highest education level: Not on file  Occupational History  . Occupation: retired  Scientific laboratory technician  . Financial resource strain: Not hard at all  . Food insecurity:    Worry: Never true    Inability: Never true  . Transportation needs:    Medical: No    Non-medical: No  Tobacco Use  . Smoking status: Former Smoker    Types: Cigarettes    Last attempt to quit: 02/28/1978    Years since quitting: 41.0  . Smokeless tobacco: Never Used  Substance and Sexual Activity  . Alcohol use: No  . Drug use: No  . Sexual activity: Yes    Partners: Male  Lifestyle  . Physical activity:    Days per week: 3 days    Minutes per session: 60 min  . Stress: Not at all  Relationships  . Social connections:    Talks on phone: More than three times a week    Gets together: More than three times a week    Attends religious service: 1 to 4 times per year    Active member of club or organization: No    Attends meetings of clubs or organizations: Never    Relationship status: Married  Other Topics Concern  . Not on file  Social History Narrative  Last seen Dr.Eason 2 years ago    Tobacco Counseling Counseling given: Not Answered   Clinical Intake:  Pre-visit preparation completed: Yes  Pain : No/denies pain     BMI - recorded: 28.08 Nutritional Status: BMI 25 -29 Overweight Nutritional Risks: None Diabetes: No  How often do you need to have someone help you when you read instructions, pamphlets, or other written materials from your doctor or pharmacy?: 1 - Never  Interpreter Needed?: No  Information entered by :: Clemetine Marker LPN   Activities of Daily Living In your present state of health, do you have any difficulty performing the  following activities: 03/09/2019  Hearing? N  Comment declines hearing aids  Vision? N  Comment wears glasses  Difficulty concentrating or making decisions? N  Walking or climbing stairs? N  Dressing or bathing? N  Doing errands, shopping? N  Preparing Food and eating ? N  Using the Toilet? N  In the past six months, have you accidently leaked urine? N  Do you have problems with loss of bowel control? N  Managing your Medications? N  Managing your Finances? N  Housekeeping or managing your Housekeeping? N  Some recent data might be hidden     Immunizations and Health Maintenance Immunization History  Administered Date(s) Administered  . Pneumococcal Conjugate-13 03/06/2019  . Tdap 03/03/2019   Health Maintenance Due  Topic Date Due  . DEXA SCAN  1946-04-05  . Hepatitis C Screening  [birth year]  . MAMMOGRAM  04/26/1964  . COLONOSCOPY  04/26/1996    Patient Care Team: Hubbard Hartshorn, FNP as PCP - General (Family Medicine)  Indicate any recent Medical Services you may have received from other than [facility] providers in the past year (date may be approximate).     Assessment:   This is a routine wellness examination for Jeanne Hickman.  Hearing/Vision screen Hearing Screening Comments: Pt denies hearing difficulty Vision Screening Comments: Vision screenings with Dr. Baldemar Lenis  Dietary issues and exercise activities discussed: Current Exercise Habits: Home exercise routine, Time (Minutes): 60, Frequency (Times/Week): 3, Weekly Exercise (Minutes/Week): 180, Intensity: Moderate, Exercise limited by: None identified  Goals    . Weight (lb) < 160 lb (72.6 kg)     Pt states she would like to lose weight over the next year to stay healthy and lower blood pressure.       Depression Screen PHQ 2/9 Scores 03/09/2019 03/03/2019 03/01/2019  PHQ - 2 Score 0 0 0  PHQ- 9 Score 0 0 0    Fall Risk Fall Risk  03/09/2019 03/03/2019 03/01/2019  Falls in the past year? 0 0 0  Number falls  in past yr: 0 0 0  Injury with Fall? 0 0 0  Follow up Falls prevention discussed Falls evaluation completed Falls evaluation completed    [place]:  Any stairs in or around the home? Yes  If so, do they handrails? Yes   Home free of loose throw rugs in walkways, pet beds, electrical cords, etc? Yes  Adequate lighting in your home to reduce risk of falls? Yes   ASSISTIVE DEVICES UTILIZED TO PREVENT FALLS:  Life alert? No  Use of a cane, walker or w/c? No  Grab bars in the bathroom? Yes Shower chair or bench in shower? Yes  Elevated toilet seat or a handicapped toilet? No   DME ORDERS:  DME order needed?  No   TIMED UP AND GO:  Was the test performed? No .  Telephonic visit.   Education: Fall risk prevention has been discussed.  Intervention(s) required? No   Cognitive Function:     6CIT Screen 03/09/2019  What Year? 0 points  What month? 0 points  What time? 0 points  Count back from 20 0 points  Months in reverse 0 points  Repeat phrase 0 points  Total Score 0    Screening Tests Health Maintenance  Topic Date Due  . DEXA SCAN  01/04/1946  . Hepatitis C Screening  1946/02/01  . MAMMOGRAM  04/26/1964  . COLONOSCOPY  04/26/1996  . INFLUENZA VACCINE  05/20/2019  . PNA vac Low Risk Adult (2 of 2 - PPSV23) 03/05/2020  . TETANUS/TDAP  03/02/2029    Qualifies for Shingles Vaccine? Yes . Due for Shingrix. Education has been provided regarding the importance of this vaccine. Pt has been advised to call insurance company to determine out of pocket expense. Advised may also receive vaccine at local pharmacy or Health Dept. Verbalized acceptance and understanding.  Tdap: Up to date  Flu Vaccine: Due for Flu vaccine. Does the patient want to receive this vaccine today?  No . Education has been provided regarding the importance of this vaccine but still declined. Advised may receive this vaccine at local pharmacy or Health Dept. Aware to  provide a copy of the vaccination record if obtained from local pharmacy or Health Dept. Verbalized acceptance and understanding.  Pneumococcal Vaccine: Up to date  Cancer Screenings:  Colorectal Screening: DUE.Referral to GI placed 03/03/19.   Mammogram: Due. Ordered 03/03/19. Pt provided with contact information and advised to call to schedule appt.   Bone Density: Due. Ordered 03/03/19. Pt provided with contact information and advised to call to schedule appt.   Lung Cancer Screening: (Low Dose CT Chest recommended if Age 38-80 years, 30 pack-year currently smoking OR have quit w/in 15years.) does not qualify.    Additional Screening:  Hepatitis C Screening: does qualify; postponed.  Vision Screening: Recommended annual ophthalmology exams for early detection of glaucoma and other disorders of the eye. Is the patient up to date with their annual eye exam?  Yes  Who is the provider or what is the name of the office in which the pt attends annual eye exams? Dr. Baldemar Lenis   Dental Screening: Recommended annual dental exams for proper oral hygiene  Community Resource Referral:  CRR required this visit?  No      Plan:    I have personally reviewed and addressed the Medicare Annual Wellness questionnaire and have noted the following in the patient's chart:  A. Medical and social history B. Use of alcohol, tobacco or illicit drugs  C. Current medications and supplements D. Functional ability and status E.  Nutritional status F.  Physical activity G. Advance directives H. List of other physicians I.  Hospitalizations, surgeries, and ER visits in previous 12 months J.  [place] such as hearing and vision if needed, cognitive and depression L. Referrals and appointments   In addition, I have reviewed and discussed with patient certain preventive protocols, quality metrics, and best practice recommendations. A written personalized care plan for preventive  services as well as general preventive health recommendations were provided to patient.   Signed,  Clemetine Marker, LPN Nurse Health Advisor   Nurse Notes: Pt doing well and appreciative of telephonic visit today. She reports taking blood pressure first thing in the morning prior to medications and still have elevated systolic pressures. Reading this morning 169/79 and during  visit pt thinks machine was malfunctioning due to reading of 238/98. Pt denies blurred vision, headache or chest pain and plans to keep a log and send information in via MyChart to [facility]. She was very pleasant and calm during visit and states she has been taking it easy at home as instructed.

## 2019-03-14 DIAGNOSIS — I10 Essential (primary) hypertension: Secondary | ICD-10-CM | POA: Diagnosis not present

## 2019-03-14 DIAGNOSIS — R809 Proteinuria, unspecified: Secondary | ICD-10-CM | POA: Diagnosis not present

## 2019-03-17 ENCOUNTER — Ambulatory Visit (INDEPENDENT_AMBULATORY_CARE_PROVIDER_SITE_OTHER): Payer: Medicare Other | Admitting: Family Medicine

## 2019-03-17 ENCOUNTER — Other Ambulatory Visit: Payer: Self-pay

## 2019-03-17 ENCOUNTER — Encounter: Payer: Self-pay | Admitting: Family Medicine

## 2019-03-17 VITALS — BP 178/75 | HR 90

## 2019-03-17 DIAGNOSIS — R809 Proteinuria, unspecified: Secondary | ICD-10-CM | POA: Diagnosis not present

## 2019-03-17 DIAGNOSIS — I10 Essential (primary) hypertension: Secondary | ICD-10-CM | POA: Diagnosis not present

## 2019-03-17 DIAGNOSIS — E782 Mixed hyperlipidemia: Secondary | ICD-10-CM | POA: Insufficient documentation

## 2019-03-17 MED ORDER — VALSARTAN-HYDROCHLOROTHIAZIDE 160-25 MG PO TABS: 1.0000 | ORAL_TABLET | Freq: Every day | ORAL | 1 refills | Status: DC

## 2019-03-17 MED ORDER — AMLODIPINE BESYLATE 5 MG PO TABS: 10.0000 mg | ORAL_TABLET | Freq: Every day | ORAL | 1 refills | Status: DC

## 2019-03-17 NOTE — Progress Notes (Signed)
Name: Jeanne Hickman   MRN: [id number]    DOB: 12-31-1945   Date:03/17/2019       Progress Note  Subjective  Chief Complaint  Chief Complaint  Patient presents with  . Hypertension    follow up BP  on yesterday 178/83  and 169/87  . Medication Refill    I connected with  [name]  on 03/17/19 at  8:00 AM EDT by a video enabled telemedicine application and verified that I am speaking with the correct person using two identifiers.  I discussed the limitations of evaluation and management by telemedicine and the availability of in person appointments. The patient expressed understanding and agreed to proceed. Staff also discussed with the patient that there may be a patient responsible charge related to this service. Patient Location: Home Provider Location: Office Additional Individuals present: None  HPI  HTN:She was told earlier this month that she was unable to have her cataract surgery due to HTN. 239/110 initially. BP's have improved significantly, but still not to goal of <150/90.  Currently on Valsartan-HCTZ 160-25 and amlodipine 5mg  - advised to increase amlodipine to 10mg  QHS. -no TIAs, no chest pain on exertion, no dyspnea on exertion, no swelling of ankles, headches, vision changes, confusion, slurred speech, or weakness. - DASH diet discussed - ptdoesfollow a low sodium diet; salt not added to cooking and salt shaker not on table.  Has been reading up on HTN diet and working on this. - Saw Dr Candiss Norse with nephrology due to albuminuria in the 700's and was told to follow up in 2-3 weeks.   Patient Active Problem List   Diagnosis Date Noted  . Cataract     Past Surgical History:  Procedure Laterality Date  . ABDOMINAL HYSTERECTOMY      No family history on file.  Social History   Socioeconomic History  . Marital status: Married    Spouse name: robert  . Number of children: 2  . Years of education: Not on file  . Highest education level:  Not on file  Occupational History  . Occupation: retired  Scientific laboratory technician  . Financial resource strain: Not hard at all  . Food insecurity:    Worry: Never true    Inability: Never true  . Transportation needs:    Medical: No    Non-medical: No  Tobacco Use  . Smoking status: Former Smoker    Types: Cigarettes    Last attempt to quit: 02/28/1978    Years since quitting: 41.0  . Smokeless tobacco: Never Used  Substance and Sexual Activity  . Alcohol use: No  . Drug use: No  . Sexual activity: Yes    Partners: Male  Lifestyle  . Physical activity:    Days per week: 3 days    Minutes per session: 60 min  . Stress: Not at all  Relationships  . Social connections:    Talks on phone: More than three times a week    Gets together: More than three times a week    Attends religious service: 1 to 4 times per year    Active member of club or organization: No    Attends meetings of clubs or organizations: Never    Relationship status: Married  . Intimate partner violence:    Fear of current or ex partner: No    Emotionally abused: No    Physically abused: No    Forced sexual activity: No  Other Topics Concern  . Not  on file  Social History Narrative   Last seen Dr.Eason 2 years ago     Current Outpatient Medications:  .  amLODipine (NORVASC) 5 MG tablet, Take 2 tablets (10 mg total) by mouth daily., Disp: 30 tablet, Rfl: 1 .  Multiple Vitamin (MULITIVITAMIN WITH MINERALS) TABS, Take 1 tablet by mouth daily., Disp: , Rfl:  .  rosuvastatin (CRESTOR) 20 MG tablet, Take 1 tablet (20 mg total) by mouth daily., Disp: 90 tablet, Rfl: 3 .  valsartan-hydrochlorothiazide (DIOVAN HCT) 160-25 MG tablet, Take 1 tablet by mouth daily., Disp: 30 tablet, Rfl: 1  No Known Allergies  I personally reviewed active problem list, medication list, allergies, notes from last encounter, lab results with the patient/caregiver today.   ROS Ten systems reviewed and is negative except as mentioned in  HPI  Objective  Virtual encounter, vitals not obtained.  There is no height or weight on file to calculate BMI.  Physical Exam  Constitutional: Patient appears well-developed and well-nourished. No distress.  HENT: Head: Normocephalic and atraumatic.  Neck: Normal range of motion. Pulmonary/Chest: Effort normal. No respiratory distress. Speaking in complete sentences Neurological: Pt is alert and oriented to person, place, and time. Coordination, speech are normal.  Psychiatric: Patient has a normal mood and affect. behavior is normal. Judgment and thought content normal.  No results found for this or any previous visit (from the past 72 hour(s)).  PHQ2/9: Depression screen [facility] 2/9 03/17/2019 03/09/2019 03/03/2019 03/01/2019  Decreased Interest 0 0 0 0  Down, Depressed, Hopeless 0 0 0 0  PHQ - 2 Score 0 0 0 0  Altered sleeping 0 0 0 0  Tired, decreased energy 0 0 0 0  Change in appetite 0 0 0 0  Feeling bad or failure about yourself  0 0 0 0  Trouble concentrating 0 0 0 0  Moving slowly or fidgety/restless 0 0 0 0  Suicidal thoughts 0 0 0 0  PHQ-9 Score 0 0 0 0  Difficult doing work/chores Not difficult at all Not difficult at all Not difficult at all Not difficult at all   PHQ-2/9 Result is negative.    Fall Risk: Fall Risk  03/17/2019 03/09/2019 03/03/2019 03/01/2019  Falls in the past year? 0 0 0 0  Number falls in past yr: 0 0 0 0  Injury with Fall? 0 0 0 0  Follow up Falls evaluation completed Falls prevention discussed Falls evaluation completed Falls evaluation completed    Assessment & Plan  1. Essential hypertension - DASH diet discussed, keep follow up with Dr. Candiss Norse with  Nephrology for further evaluation.  - BASIC METABOLIC PANEL WITH GFR - valsartan-hydrochlorothiazide (DIOVAN HCT) 160-25 MG tablet; Take 1 tablet by mouth daily.  Dispense: 30 tablet; Refill: 1 - amLODipine (NORVASC) 5 MG tablet; Take 2 tablets (10 mg total) by mouth daily.  Dispense: 30 tablet;  Refill: 1  I discussed the assessment and treatment plan with the patient. The patient was provided an opportunity to ask questions and all were answered. The patient agreed with the plan and demonstrated an understanding of the instructions.  The patient was advised to call back or seek an in-person evaluation if the symptoms worsen or if the condition fails to improve as anticipated.  I provided 15 minutes of non-face-to-face time during this encounter.

## 2019-03-17 NOTE — Patient Instructions (Signed)
DASH Eating Plan  DASH stands for "Dietary Approaches to Stop Hypertension." The DASH eating plan is a healthy eating plan that has been shown to reduce high blood pressure (hypertension). It may also reduce your risk for type 2 diabetes, heart disease, and stroke. The DASH eating plan may also help with weight loss.  What are tips for following this plan?    General guidelines   Avoid eating more than 2,300 mg (milligrams) of salt (sodium) a day. If you have hypertension, you may need to reduce your sodium intake to 1,500 mg a day.   Limit alcohol intake to no more than 1 drink a day for nonpregnant women and 2 drinks a day for men. One drink equals 12 oz of beer, 5 oz of wine, or 1 oz of hard liquor.   Work with your health care provider to maintain a healthy body weight or to lose weight. Ask what an ideal weight is for you.   Get at least 30 minutes of exercise that causes your heart to beat faster (aerobic exercise) most days of the week. Activities may include walking, swimming, or biking.   Work with your health care provider or diet and nutrition specialist (dietitian) to adjust your eating plan to your individual calorie needs.  Reading food labels     Check food labels for the amount of sodium per serving. Choose foods with less than 5 percent of the Daily Value of sodium. Generally, foods with less than 300 mg of sodium per serving fit into this eating plan.   To find whole grains, look for the word "whole" as the first word in the ingredient list.  Shopping   Buy products labeled as "low-sodium" or "no salt added."   Buy fresh foods. Avoid canned foods and premade or frozen meals.  Cooking   Avoid adding salt when cooking. Use salt-free seasonings or herbs instead of table salt or sea salt. Check with your health care provider or pharmacist before using salt substitutes.   Do not fry foods. Cook foods using healthy methods such as baking, boiling, grilling, and broiling instead.   Cook with  heart-healthy oils, such as olive, canola, soybean, or sunflower oil.  Meal planning   Eat a balanced diet that includes:  ? 5 or more servings of fruits and vegetables each day. At each meal, try to fill half of your plate with fruits and vegetables.  ? Up to 6-8 servings of whole grains each day.  ? Less than 6 oz of lean meat, poultry, or fish each day. A 3-oz serving of meat is about the same size as a deck of cards. One egg equals 1 oz.  ? 2 servings of low-fat dairy each day.  ? A serving of nuts, seeds, or beans 5 times each week.  ? Heart-healthy fats. Healthy fats called Omega-3 fatty acids are found in foods such as flaxseeds and coldwater fish, like sardines, salmon, and mackerel.   Limit how much you eat of the following:  ? Canned or prepackaged foods.  ? Food that is high in trans fat, such as fried foods.  ? Food that is high in saturated fat, such as fatty meat.  ? Sweets, desserts, sugary drinks, and other foods with added sugar.  ? Full-fat dairy products.   Do not salt foods before eating.   Try to eat at least 2 vegetarian meals each week.   Eat more home-cooked food and less restaurant, buffet, and fast food.     When eating at a restaurant, ask that your food be prepared with less salt or no salt, if possible.  What foods are recommended?  The items listed may not be a complete list. Talk with your dietitian about what dietary choices are best for you.  Grains  Whole-grain or whole-wheat bread. Whole-grain or whole-wheat pasta. Brown rice. Oatmeal. Quinoa. Bulgur. Whole-grain and low-sodium cereals. Pita bread. Low-fat, low-sodium crackers. Whole-wheat flour tortillas.  Vegetables  Fresh or frozen vegetables (raw, steamed, roasted, or grilled). Low-sodium or reduced-sodium tomato and vegetable juice. Low-sodium or reduced-sodium tomato sauce and tomato paste. Low-sodium or reduced-sodium canned vegetables.  Fruits  All fresh, dried, or frozen fruit. Canned fruit in natural juice (without  added sugar).  Meat and other protein foods  Skinless chicken or turkey. Ground chicken or turkey. Pork with fat trimmed off. Fish and seafood. Egg whites. Dried beans, peas, or lentils. Unsalted nuts, nut butters, and seeds. Unsalted canned beans. Lean cuts of beef with fat trimmed off. Low-sodium, lean deli meat.  Dairy  Low-fat (1%) or fat-free (skim) milk. Fat-free, low-fat, or reduced-fat cheeses. Nonfat, low-sodium ricotta or cottage cheese. Low-fat or nonfat yogurt. Low-fat, low-sodium cheese.  Fats and oils  Soft margarine without trans fats. Vegetable oil. Low-fat, reduced-fat, or light mayonnaise and salad dressings (reduced-sodium). Canola, safflower, olive, soybean, and sunflower oils. Avocado.  Seasoning and other foods  Herbs. Spices. Seasoning mixes without salt. Unsalted popcorn and pretzels. Fat-free sweets.  What foods are not recommended?  The items listed may not be a complete list. Talk with your dietitian about what dietary choices are best for you.  Grains  Baked goods made with fat, such as croissants, muffins, or some breads. Dry pasta or rice meal packs.  Vegetables  Creamed or fried vegetables. Vegetables in a cheese sauce. Regular canned vegetables (not low-sodium or reduced-sodium). Regular canned tomato sauce and paste (not low-sodium or reduced-sodium). Regular tomato and vegetable juice (not low-sodium or reduced-sodium). Pickles. Olives.  Fruits  Canned fruit in a light or heavy syrup. Fried fruit. Fruit in cream or butter sauce.  Meat and other protein foods  Fatty cuts of meat. Ribs. Fried meat. Bacon. Sausage. Bologna and other processed lunch meats. Salami. Fatback. Hotdogs. Bratwurst. Salted nuts and seeds. Canned beans with added salt. Canned or smoked fish. Whole eggs or egg yolks. Chicken or turkey with skin.  Dairy  Whole or 2% milk, cream, and half-and-half. Whole or full-fat cream cheese. Whole-fat or sweetened yogurt. Full-fat cheese. Nondairy creamers. Whipped toppings.  Processed cheese and cheese spreads.  Fats and oils  Butter. Stick margarine. Lard. Shortening. Ghee. Bacon fat. Tropical oils, such as coconut, palm kernel, or palm oil.  Seasoning and other foods  Salted popcorn and pretzels. Onion salt, garlic salt, seasoned salt, table salt, and sea salt. Worcestershire sauce. Tartar sauce. Barbecue sauce. Teriyaki sauce. Soy sauce, including reduced-sodium. Steak sauce. Canned and packaged gravies. Fish sauce. Oyster sauce. Cocktail sauce. Horseradish that you find on the shelf. Ketchup. Mustard. Meat flavorings and tenderizers. Bouillon cubes. Hot sauce and Tabasco sauce. Premade or packaged marinades. Premade or packaged taco seasonings. Relishes. Regular salad dressings.  Where to find more information:   National Heart, Lung, and Blood Institute: www.nhlbi.nih.gov   American Heart Association: www.heart.org  Summary   The DASH eating plan is a healthy eating plan that has been shown to reduce high blood pressure (hypertension). It may also reduce your risk for type 2 diabetes, heart disease, and stroke.   With the   DASH eating plan, you should limit salt (sodium) intake to 2,300 mg a day. If you have hypertension, you may need to reduce your sodium intake to 1,500 mg a day.   When on the DASH eating plan, aim to eat more fresh fruits and vegetables, whole grains, lean proteins, low-fat dairy, and heart-healthy fats.   Work with your health care provider or diet and nutrition specialist (dietitian) to adjust your eating plan to your individual calorie needs.  This information is not intended to replace advice given to you by your health care provider. Make sure you discuss any questions you have with your health care provider.  Document Released: 09/24/2011 Document Revised: 09/28/2016 Document Reviewed: 09/28/2016  Elsevier Interactive Patient Education  2019 [employer]

## 2019-03-23 ENCOUNTER — Ambulatory Visit (INDEPENDENT_AMBULATORY_CARE_PROVIDER_SITE_OTHER): Payer: Medicare Other

## 2019-03-23 ENCOUNTER — Other Ambulatory Visit (INDEPENDENT_AMBULATORY_CARE_PROVIDER_SITE_OTHER): Payer: Self-pay | Admitting: Nephrology

## 2019-03-23 ENCOUNTER — Other Ambulatory Visit: Payer: Self-pay

## 2019-03-23 DIAGNOSIS — I10 Essential (primary) hypertension: Secondary | ICD-10-CM

## 2019-03-28 ENCOUNTER — Telehealth: Payer: Self-pay | Admitting: Emergency Medicine

## 2019-03-28 NOTE — Telephone Encounter (Signed)
Please advise 

## 2019-03-28 NOTE — Telephone Encounter (Signed)
Copied from [place] [id number]. Topic: General - Inquiry >> Mar 27, 2019  9:34 AM Virl Axe D wrote: Reason for CRM: Pt stated she went to her appt with Dr. Candiss Norse whom Raquel Sarna referred her too. Pt stated Dr. Candiss Norse changed the dosage on her valsartan-hydrochlorothiazide (DIOVAN HCT) 160-25 MG tablet due to some lab results. She would like to know if Raquel Sarna felt this was okay due to just being prescribed valsartan by Raquel Sarna. She wants to know Emily's thoughts before she picks up new rx. Please advise. CB#[phone]

## 2019-03-29 NOTE — Telephone Encounter (Signed)
Yes, absolutely, please follow Dr. Keturah Barre recommendations for medication dosing changes.  Thanks!

## 2019-03-29 NOTE — Telephone Encounter (Signed)
Patient notified

## 2019-03-30 DIAGNOSIS — I10 Essential (primary) hypertension: Secondary | ICD-10-CM | POA: Diagnosis not present

## 2019-03-30 DIAGNOSIS — R809 Proteinuria, unspecified: Secondary | ICD-10-CM | POA: Diagnosis not present

## 2019-03-31 ENCOUNTER — Ambulatory Visit: Payer: Medicare Other

## 2019-04-06 ENCOUNTER — Ambulatory Visit: Payer: Medicare Other

## 2019-04-06 ENCOUNTER — Other Ambulatory Visit: Payer: Self-pay

## 2019-04-06 VITALS — BP 160/88

## 2019-04-06 DIAGNOSIS — I10 Essential (primary) hypertension: Secondary | ICD-10-CM

## 2019-04-06 NOTE — Progress Notes (Signed)
Patient here for blood pressure check.  First reading 172/90 and second reading 160/88.  Patient states at home it runs good, but anytime she comes to Doctors office it shoots up.  She states will bring in readings from home.  She is currently on Diovan which Dr. Candiss Norse provided due to low potassium and Amlodopine.

## 2019-04-11 ENCOUNTER — Other Ambulatory Visit: Payer: Self-pay | Admitting: Family Medicine

## 2019-04-11 DIAGNOSIS — I10 Essential (primary) hypertension: Secondary | ICD-10-CM

## 2019-04-13 DIAGNOSIS — I10 Essential (primary) hypertension: Secondary | ICD-10-CM | POA: Diagnosis not present

## 2019-04-13 DIAGNOSIS — R809 Proteinuria, unspecified: Secondary | ICD-10-CM | POA: Diagnosis not present

## 2019-04-17 ENCOUNTER — Telehealth: Payer: Self-pay | Admitting: Family Medicine

## 2019-04-17 DIAGNOSIS — I10 Essential (primary) hypertension: Secondary | ICD-10-CM

## 2019-04-24 MED ORDER — AMLODIPINE BESYLATE 10 MG PO TABS: 10.0000 mg | ORAL_TABLET | Freq: Every day | ORAL | 3 refills | Status: DC

## 2019-04-24 NOTE — Telephone Encounter (Signed)
Patient stated she went to his office last week. Has not discussed BP with his office. BP now is 214/91.Can she take th Amlodipine that was prescribed at night. Has not taken Amlodipine 5 mg 1 week. Patient currently taking Valsartan 160 qd.

## 2019-04-24 NOTE — Telephone Encounter (Signed)
Please advise that she contact Dr. Keturah Barre office with elevated BP reading.  He is currently managing her BP medications.

## 2019-04-24 NOTE — Telephone Encounter (Signed)
Pt called and stated that she does not have refills are pharmacy. Pt states that she has no medication left. Please advise.

## 2019-04-24 NOTE — Telephone Encounter (Signed)
Please advise 

## 2019-04-24 NOTE — Telephone Encounter (Signed)
Spoke to [facility] at Dr. Candiss Norse office patient is taking Valsartan 160 mg and Amlodipine 10 mg. Patient stated that she had not taken the Amlodipine 10 mg.  Per Raquel Sarna patient was informed to take the 10 mg Amlodipine and a new script was sent to pharmacy. Patient refused ER or UC and was informed to start taking Amlodipine soon as she pick up and notified of any symptoms of stroke to go to ER. Patient is to call office first thing in the morning with BP results so she can be informed of time to come in office.

## 2019-04-24 NOTE — Telephone Encounter (Signed)
Amlodipine 10mg  sent to [facility] - needs to take immediately upon obtaining.  I did recommend Urgent Care for IV hydralazine but she refused to go.  Please review stroke symptoms and if she develops any symptoms she must go to ER immediately.

## 2019-04-24 NOTE — Telephone Encounter (Signed)
Pt states that she has had elevated BP 177/86. Pulse 69. No other symptoms.

## 2019-04-24 NOTE — Addendum Note (Signed)
Addended by: Hubbard Hartshorn on: 04/24/2019 01:50 PM   Modules accepted: Orders

## 2019-04-26 ENCOUNTER — Ambulatory Visit (INDEPENDENT_AMBULATORY_CARE_PROVIDER_SITE_OTHER): Payer: Medicare Other | Admitting: Family Medicine

## 2019-04-26 ENCOUNTER — Other Ambulatory Visit: Payer: Self-pay

## 2019-04-26 ENCOUNTER — Telehealth: Payer: Self-pay | Admitting: Family Medicine

## 2019-04-26 ENCOUNTER — Encounter: Payer: Self-pay | Admitting: Family Medicine

## 2019-04-26 VITALS — BP 198/84 | HR 86

## 2019-04-26 DIAGNOSIS — R809 Proteinuria, unspecified: Secondary | ICD-10-CM | POA: Diagnosis not present

## 2019-04-26 DIAGNOSIS — I10 Essential (primary) hypertension: Secondary | ICD-10-CM | POA: Diagnosis not present

## 2019-04-26 MED ORDER — HYDRALAZINE HCL 25 MG PO TABS: 25.0000 mg | ORAL_TABLET | Freq: Three times a day (TID) | ORAL | 1 refills | Status: DC

## 2019-04-26 NOTE — Progress Notes (Signed)
Name: Jeanne Hickman   MRN: [id number]    DOB: 08/18/1946   Date:04/26/2019       Progress Note  Subjective  Chief Complaint  Chief Complaint  Patient presents with  . Hypertension    follow up    I connected with  Jeanne Hickman  on 04/26/19 at  8:20 AM EDT by a video enabled telemedicine application and verified that I am speaking with the correct person using two identifiers.  I discussed the limitations of evaluation and management by telemedicine and the availability of in person appointments. The patient expressed understanding and agreed to proceed. Staff also discussed with the patient that there may be a patient responsible charge related to this service. Patient Location: Home Provider Location: Home Additional Individuals present: None  HPI  Pt presents for HTN follow up.  She noticed a spike in her BP on 04/19/2019, then BP continued to rise over the course of several days - readings 177/86, 214/91.  She had apparently only been taking her Valsartan 160mg , and not taking amlodipine 10mg .  I resent this prescription on 04/24/2019 and she started immediately.  She has not had any red flag symptoms - no headaches, chest pain, shortness of breath, BLE edema, changes in urination, fatigue, facial droop, confusion, or vision changes.  Her BP today is 198/84.  She did see Dr. Candiss Norse about 2 weeks ago and was taken off of the HCTZ as she has albuminuria.  She also reports having had renal US which she states was normal. Unfortunately, we have not received his notes from this visit yet, so this information is reported from the patient.   Patient Active Problem List   Diagnosis Date Noted  . Essential hypertension 03/17/2019  . Mixed hyperlipidemia 03/17/2019  . Albuminuria 03/17/2019  . Cataract     Past Surgical History:  Procedure Laterality Date  . ABDOMINAL HYSTERECTOMY      No family history on file.  Social History   Socioeconomic History  . Marital  status: Married    Spouse name: robert  . Number of children: 2  . Years of education: Not on file  . Highest education level: Not on file  Occupational History  . Occupation: retired  Scientific laboratory technician  . Financial resource strain: Not hard at all  . Food insecurity    Worry: Never true    Inability: Never true  . Transportation needs    Medical: No    Non-medical: No  Tobacco Use  . Smoking status: Former Smoker    Types: Cigarettes    Quit date: 02/28/1978    Years since quitting: 41.1  . Smokeless tobacco: Never Used  Substance and Sexual Activity  . Alcohol use: No  . Drug use: No  . Sexual activity: Yes    Partners: Male  Lifestyle  . Physical activity    Days per week: 3 days    Minutes per session: 60 min  . Stress: Not at all  Relationships  . Social connections    Talks on phone: More than three times a week    Gets together: More than three times a week    Attends religious service: 1 to 4 times per year    Active member of club or organization: No    Attends meetings of clubs or organizations: Never    Relationship status: Married  . Intimate partner violence    Fear of current or ex partner: No    Emotionally abused:  No    Physically abused: No    Forced sexual activity: No  Other Topics Concern  . Not on file  Social History Narrative   Last seen Dr.Eason 2 years ago     Current Outpatient Medications:  .  aMILoride (MIDAMOR) 5 MG tablet, Take 5 mg by mouth daily., Disp: , Rfl:  .  amLODipine (NORVASC) 10 MG tablet, Take 1 tablet (10 mg total) by mouth daily., Disp: 90 tablet, Rfl: 3 .  Multiple Vitamin (MULITIVITAMIN WITH MINERALS) TABS, Take 1 tablet by mouth daily., Disp: , Rfl:  .  rosuvastatin (CRESTOR) 20 MG tablet, Take 1 tablet (20 mg total) by mouth daily., Disp: 90 tablet, Rfl: 3 .  valsartan (DIOVAN) 160 MG tablet, Take 160 mg by mouth daily., Disp: , Rfl:  .  valsartan-hydrochlorothiazide (DIOVAN HCT) 160-25 MG tablet, Take 1 tablet by  mouth daily. (Patient not taking: Reported on 04/26/2019), Disp: 30 tablet, Rfl: 1  No Known Allergies  I personally reviewed active problem list, medication list, allergies, notes from last encounter, lab results with the patient/caregiver today.   ROS Ten systems reviewed and is negative except as mentioned in HPI  Objective  Today's Vitals   04/26/19 0815  BP: (!) 198/84  Pulse: 86   There is no height or weight on file to calculate BMI.  There is no height or weight on file to calculate BMI.  Physical Exam Constitutional: Patient appears well-developed and well-nourished. No distress.  HENT: Head: Normocephalic and atraumatic.  Neck: Normal range of motion. Pulmonary/Chest: Effort normal. No respiratory distress. Speaking in complete sentences Neurological: Pt is alert and oriented to person, place, and time. Coordination, speech are normal.  Psychiatric: Patient has a normal mood and affect. behavior is normal. Judgment and thought content normal.  No results found for this or any previous visit (from the past 72 hour(s)).  PHQ2/9: Depression screen [facility] 2/9 04/26/2019 03/17/2019 03/09/2019 03/03/2019 03/01/2019  Decreased Interest 0 0 0 0 0  Down, Depressed, Hopeless 0 0 0 0 0  PHQ - 2 Score 0 0 0 0 0  Altered sleeping 0 0 0 0 0  Tired, decreased energy 0 0 0 0 0  Change in appetite 0 0 0 0 0  Feeling bad or failure about yourself  0 0 0 0 0  Trouble concentrating 0 0 0 0 0  Moving slowly or fidgety/restless 0 0 0 0 0  Suicidal thoughts 0 0 0 0 0  PHQ-9 Score 0 0 0 0 0  Difficult doing work/chores Not difficult at all Not difficult at all Not difficult at all Not difficult at all Not difficult at all   PHQ-2/9 Result is negative.    Fall Risk: Fall Risk  04/26/2019 03/17/2019 03/09/2019 03/03/2019 03/01/2019  Falls in the past year? 0 0 0 0 0  Number falls in past yr: 0 0 0 0 0  Injury with Fall? 0 0 0 0 0  Follow up Falls evaluation completed Falls evaluation completed  Falls prevention discussed Falls evaluation completed Falls evaluation completed    Assessment & Plan  1. Essential hypertension - Continue amlodipine 10mg , Valsartan 160mg , add hydralazine TID dosing, we will have nurse check in on phone daily for the next 2 days.  Did consult with Attending MD Dr. Ancil Boozer who is in agreement with this plan of care at this time.  Patient has refused UC and ER in the past and does not want to go to IV hydralazine. -  hydrALAZINE (APRESOLINE) 25 MG tablet; Take 1 tablet (25 mg total) by mouth 3 (three) times daily.  Dispense: 90 tablet; Refill: 1 - May consider adrenal adenoma evaluation if she has fluctuations in BP again.  2. Albuminuria - Seeing Dr. Candiss Norse, keep follow up.  I discussed the assessment and treatment plan with the patient. The patient was provided an opportunity to ask questions and all were answered. The patient agreed with the plan and demonstrated an understanding of the instructions.  The patient was advised to call back or seek an in-person evaluation if the symptoms worsen or if the condition fails to improve as anticipated.  I provided 16 minutes of non-face-to-face time during this encounter.

## 2019-04-26 NOTE — Telephone Encounter (Signed)
Please call patient 04/27/19 and 04/28/19 to check on BP readings and symptoms.

## 2019-04-27 NOTE — Telephone Encounter (Signed)
Pt called stating her BP levels in her right arm was 155/69 w/ pulse 72. And in her left arm 162/72 w/ pulse 75. Please advise.

## 2019-04-27 NOTE — Telephone Encounter (Signed)
Monitor for 1 additional day.  Continue Hydralazine 25mg  TID as prescribed.

## 2019-04-27 NOTE — Telephone Encounter (Signed)
Please advise. It is going down

## 2019-04-28 NOTE — Telephone Encounter (Signed)
bp is 142/68 pulse 73 Right arm 132/68 pulse 75

## 2019-04-28 NOTE — Telephone Encounter (Signed)
Let's stay at her current medications including the hydralazine.  BP looks like it is improving. Recheck in 2 weeks with repeat BMP.  Schedule nurse visit.

## 2019-05-01 ENCOUNTER — Telehealth: Payer: Self-pay

## 2019-05-01 NOTE — Telephone Encounter (Signed)
Copied from [id number]. Topic: Quick Communication - See Telephone Encounter >> May 01, 2019  8:23 AM Loma [place] wrote: CRM for notification. See Telephone encounter for: 05/01/19.pt called in to inform [facility] of pulse, Left  is 143/66 P 77 Right is 135/58  P 72 Phone # is [phone]

## 2019-05-02 ENCOUNTER — Telehealth: Payer: Self-pay | Admitting: Family Medicine

## 2019-05-02 NOTE — Telephone Encounter (Signed)
Pt.notified

## 2019-05-02 NOTE — Telephone Encounter (Signed)
Patient  Called to give BP reading for this morning for her PCP. Right ARM 137/64 pulse 78 & Left ARM 128/60 pulse 77. If any question please call patient back.

## 2019-05-02 NOTE — Telephone Encounter (Signed)
Excellent, continue hydroxyzine TID dosing along with her other medications at this time.  She can continue to create a log of her daily BP's.  Schedule 2 week follow up from last visit please.

## 2019-05-15 ENCOUNTER — Encounter: Payer: Self-pay | Admitting: Family Medicine

## 2019-05-15 ENCOUNTER — Ambulatory Visit (INDEPENDENT_AMBULATORY_CARE_PROVIDER_SITE_OTHER): Payer: Medicare Other | Admitting: Family Medicine

## 2019-05-15 ENCOUNTER — Other Ambulatory Visit: Payer: Self-pay

## 2019-05-15 VITALS — BP 150/63 | HR 79

## 2019-05-15 DIAGNOSIS — R809 Proteinuria, unspecified: Secondary | ICD-10-CM

## 2019-05-15 DIAGNOSIS — I10 Essential (primary) hypertension: Secondary | ICD-10-CM

## 2019-05-15 NOTE — Progress Notes (Signed)
Name: Jeanne Hickman   MRN: [id number]    DOB: 03-01-1946   Date:05/15/2019       Progress Note  Subjective  Chief Complaint  Chief Complaint  Patient presents with  . Follow-up  . Hypertension    130/58, 118/66     I connected with  Jeanne Hickman on 05/15/19 at  7:40 AM EDT by telephone and verified that I am speaking with the correct person using two identifiers.  I discussed the limitations, risks, security and privacy concerns of performing an evaluation and management service by telephone and the availability of in person appointments. Staff also discussed with the patient that there may be a patient responsible charge related to this service. Patient Location: Home Provider Location: Office Additional Individuals present: None  HPI  Pt presents for HTN follow up.  She noticed a spike in her BP on 04/19/2019, then BP continued to rise over the course of several days - readings 177/86, 214/91.  At last visit she restarted her amlodipine and added hyralazine TID.  Since then, her BP's have been under much better control.  She has not had any red flag symptoms - no headaches, chest pain, shortness of breath, BLE edema, changes in urination, fatigue, facial droop, confusion, or vision changes.  Her BP today is 138/63, HR 73 at home.   She did see Dr. Candiss Hickman about 2 weeks ago and was taken off of the HCTZ as she has albuminuria.  She also reports having had renal US which she states was normal. Unfortunately, we have not received his notes from this visit yet, so this information is reported from the patient.   Patient Active Problem List   Diagnosis Date Noted  . Essential hypertension 03/17/2019  . Mixed hyperlipidemia 03/17/2019  . Albuminuria 03/17/2019  . Cataract     Past Surgical History:  Procedure Laterality Date  . ABDOMINAL HYSTERECTOMY      No family history on file.  Social History   Socioeconomic History  . Marital status: Married    Spouse  name: robert  . Number of children: 2  . Years of education: Not on file  . Highest education level: Not on file  Occupational History  . Occupation: retired  Scientific laboratory technician  . Financial resource strain: Not hard at all  . Food insecurity    Worry: Never true    Inability: Never true  . Transportation needs    Medical: No    Non-medical: No  Tobacco Use  . Smoking status: Former Smoker    Types: Cigarettes    Quit date: 02/28/1978    Years since quitting: 41.2  . Smokeless tobacco: Never Used  Substance and Sexual Activity  . Alcohol use: No  . Drug use: No  . Sexual activity: Yes    Partners: Male  Lifestyle  . Physical activity    Days per week: 3 days    Minutes per session: 60 min  . Stress: Not at all  Relationships  . Social connections    Talks on phone: More than three times a week    Gets together: More than three times a week    Attends religious service: 1 to 4 times per year    Active member of club or organization: No    Attends meetings of clubs or organizations: Never    Relationship status: Married  . Intimate partner violence    Fear of current or ex partner: No    Emotionally  abused: No    Physically abused: No    Forced sexual activity: No  Other Topics Concern  . Not on file  Social History Narrative   Last seen Dr.Eason 2 years ago     Current Outpatient Medications:  .  aMILoride (MIDAMOR) 5 MG tablet, Take 5 mg by mouth daily., Disp: , Rfl:  .  amLODipine (NORVASC) 10 MG tablet, Take 1 tablet (10 mg total) by mouth daily., Disp: 90 tablet, Rfl: 3 .  hydrALAZINE (APRESOLINE) 25 MG tablet, Take 1 tablet (25 mg total) by mouth 3 (three) times daily., Disp: 90 tablet, Rfl: 1 .  Multiple Vitamin (MULITIVITAMIN WITH MINERALS) TABS, Take 1 tablet by mouth daily., Disp: , Rfl:  .  rosuvastatin (CRESTOR) 20 MG tablet, Take 1 tablet (20 mg total) by mouth daily., Disp: 90 tablet, Rfl: 3 .  valsartan (DIOVAN) 160 MG tablet, Take 160 mg by mouth daily.,  Disp: , Rfl:   No Known Allergies  I personally reviewed active problem list, medication list, allergies, notes from last encounter, lab results with the patient/caregiver today.   ROS  Constitutional: Negative for fever or weight change.  Respiratory: Negative for cough and shortness of breath.   Cardiovascular: Negative for chest pain or palpitations.  Gastrointestinal: Negative for abdominal pain, no bowel changes.  Musculoskeletal: Negative for gait problem or joint swelling.  Skin: Negative for rash.  Neurological: Negative for dizziness or headache.  No other specific complaints in a complete review of systems (except as listed in HPI above).  Objective  Virtual encounter, vitals not obtained.  There is no height or weight on file to calculate BMI.  Physical Exam  Pulmonary/Chest: Effort normal. No respiratory distress. Speaking in complete sentences Neurological: Pt is alert and oriented to person, place, and time. Speech is normal. Psychiatric: Patient has a normal mood and affect. behavior is normal. Judgment and thought content normal.  No results found for this or any previous visit (from the past 72 hour(s)).  PHQ2/9: Depression screen [facility] 2/9 05/15/2019 04/26/2019 03/17/2019 03/09/2019 03/03/2019  Decreased Interest 0 0 0 0 0  Down, Depressed, Hopeless 0 0 0 0 0  PHQ - 2 Score 0 0 0 0 0  Altered sleeping 0 0 0 0 0  Tired, decreased energy 0 0 0 0 0  Change in appetite 0 0 0 0 0  Feeling bad or failure about yourself  0 0 0 0 0  Trouble concentrating 0 0 0 0 0  Moving slowly or fidgety/restless 0 0 0 0 0  Suicidal thoughts 0 0 0 0 0  PHQ-9 Score 0 0 0 0 0  Difficult doing work/chores Not difficult at all Not difficult at all Not difficult at all Not difficult at all Not difficult at all   PHQ-2/9 Result is negative.    Fall Risk: Fall Risk  05/15/2019 04/26/2019 03/17/2019 03/09/2019 03/03/2019  Falls in the past year? 0 0 0 0 0  Number falls in past yr: 0 0 0 0 0   Injury with Fall? 0 0 0 0 0  Follow up Falls evaluation completed Falls evaluation completed Falls evaluation completed Falls prevention discussed Falls evaluation completed    Assessment & Plan  1. Essential hypertension - Continue current regimen, come in to office for 1 BP check this week so that we can clear for cataract surgery.  2. Albuminuria - Continue follow up with Nephrology per their direction.  I discussed the assessment and treatment plan with the  patient. The patient was provided an opportunity to ask questions and all were answered. The patient agreed with the plan and demonstrated an understanding of the instructions.   The patient was advised to call back or seek an in-person evaluation if the symptoms worsen or if the condition fails to improve as anticipated.  I provided 14 minutes of non-face-to-face time during this encounter.  Hubbard Hartshorn, FNP

## 2019-06-09 ENCOUNTER — Other Ambulatory Visit: Payer: Self-pay

## 2019-06-09 ENCOUNTER — Ambulatory Visit: Payer: Medicare Other | Admitting: Emergency Medicine

## 2019-06-09 ENCOUNTER — Encounter: Payer: Self-pay | Admitting: Family Medicine

## 2019-06-09 VITALS — BP 138/78 | HR 82

## 2019-06-09 DIAGNOSIS — I10 Essential (primary) hypertension: Secondary | ICD-10-CM

## 2019-06-12 ENCOUNTER — Ambulatory Visit: Payer: Medicare Other | Admitting: Family Medicine

## 2019-06-12 ENCOUNTER — Other Ambulatory Visit: Payer: Self-pay

## 2019-06-19 DIAGNOSIS — H2513 Age-related nuclear cataract, bilateral: Secondary | ICD-10-CM | POA: Diagnosis not present

## 2019-06-19 DIAGNOSIS — H25013 Cortical age-related cataract, bilateral: Secondary | ICD-10-CM | POA: Diagnosis not present

## 2019-06-19 DIAGNOSIS — H35033 Hypertensive retinopathy, bilateral: Secondary | ICD-10-CM | POA: Diagnosis not present

## 2019-06-19 DIAGNOSIS — H2589 Other age-related cataract: Secondary | ICD-10-CM | POA: Diagnosis not present

## 2019-06-21 ENCOUNTER — Encounter: Payer: Self-pay | Admitting: Family Medicine

## 2019-06-24 ENCOUNTER — Other Ambulatory Visit: Payer: Self-pay | Admitting: Family Medicine

## 2019-06-24 DIAGNOSIS — I10 Essential (primary) hypertension: Secondary | ICD-10-CM

## 2019-06-24 NOTE — Telephone Encounter (Signed)
Requested Prescriptions  Pending Prescriptions Disp Refills  . hydrALAZINE (APRESOLINE) 25 MG tablet [Pharmacy Med Name: hydrALAZINE HCl 25 MG Oral Tablet] 90 tablet 0    Sig: TAKE 1 TABLET BY MOUTH THREE TIMES DAILY     Cardiovascular:  Vasodilators Passed - 06/24/2019  8:34 AM      Passed - HCT in normal range and within 360 days    HCT  Date Value Ref Range Status  03/03/2019 39.5 35.0 - 45.0 % Final         Passed - HGB in normal range and within 360 days    Hemoglobin  Date Value Ref Range Status  03/03/2019 12.8 11.7 - 15.5 g/dL Final         Passed - RBC in normal range and within 360 days    RBC  Date Value Ref Range Status  03/03/2019 4.81 3.80 - 5.10 Million/uL Final         Passed - WBC in normal range and within 360 days    WBC  Date Value Ref Range Status  03/03/2019 6.2 3.8 - 10.8 Thousand/uL Final         Passed - PLT in normal range and within 360 days    Platelets  Date Value Ref Range Status  03/03/2019 252 140 - 400 Thousand/uL Final         Passed - Last BP in normal range    BP Readings from Last 1 Encounters:  06/09/19 138/78         Passed - Valid encounter within last 12 months    Recent Outpatient Visits          1 month ago Essential hypertension   [place], [place], FNP   1 month ago Essential hypertension   [place], [place], FNP   3 months ago Essential hypertension   [place], FNP   3 months ago Need for Tdap vaccination   [place], [place], FNP   3 months ago Essential hypertension   [place], FNP             was to f/u in office 8/24; pt. Preferred to be checked in car; BP checked 8/21 and stable.  30 day refill given.

## 2019-06-27 DIAGNOSIS — H25812 Combined forms of age-related cataract, left eye: Secondary | ICD-10-CM | POA: Diagnosis not present

## 2019-06-27 DIAGNOSIS — H2512 Age-related nuclear cataract, left eye: Secondary | ICD-10-CM | POA: Diagnosis not present

## 2019-07-10 DIAGNOSIS — R808 Other proteinuria: Secondary | ICD-10-CM | POA: Diagnosis not present

## 2019-07-10 DIAGNOSIS — E876 Hypokalemia: Secondary | ICD-10-CM | POA: Diagnosis not present

## 2019-07-10 DIAGNOSIS — I10 Essential (primary) hypertension: Secondary | ICD-10-CM | POA: Diagnosis not present

## 2019-07-24 ENCOUNTER — Other Ambulatory Visit: Payer: Self-pay | Admitting: Family Medicine

## 2019-07-24 DIAGNOSIS — I10 Essential (primary) hypertension: Secondary | ICD-10-CM

## 2019-07-26 DIAGNOSIS — H25011 Cortical age-related cataract, right eye: Secondary | ICD-10-CM | POA: Diagnosis not present

## 2019-07-26 DIAGNOSIS — H2511 Age-related nuclear cataract, right eye: Secondary | ICD-10-CM | POA: Diagnosis not present

## 2019-07-31 ENCOUNTER — Encounter: Payer: Self-pay | Admitting: Family Medicine

## 2019-08-01 DIAGNOSIS — H2511 Age-related nuclear cataract, right eye: Secondary | ICD-10-CM | POA: Diagnosis not present

## 2019-08-01 DIAGNOSIS — H25811 Combined forms of age-related cataract, right eye: Secondary | ICD-10-CM | POA: Diagnosis not present

## 2019-08-01 DIAGNOSIS — H25011 Cortical age-related cataract, right eye: Secondary | ICD-10-CM | POA: Diagnosis not present

## 2019-08-25 ENCOUNTER — Other Ambulatory Visit: Payer: Self-pay | Admitting: Family Medicine

## 2019-08-25 DIAGNOSIS — I10 Essential (primary) hypertension: Secondary | ICD-10-CM

## 2019-08-25 MED ORDER — HYDRALAZINE HCL 25 MG PO TABS: 25.0000 mg | ORAL_TABLET | Freq: Three times a day (TID) | ORAL | 0 refills | Status: DC

## 2019-10-28 ENCOUNTER — Other Ambulatory Visit: Payer: Self-pay | Admitting: Family Medicine

## 2019-10-28 DIAGNOSIS — I10 Essential (primary) hypertension: Secondary | ICD-10-CM

## 2019-10-30 ENCOUNTER — Other Ambulatory Visit: Payer: Self-pay | Admitting: Family Medicine

## 2019-10-30 DIAGNOSIS — I10 Essential (primary) hypertension: Secondary | ICD-10-CM

## 2019-11-15 DIAGNOSIS — Z23 Encounter for immunization: Secondary | ICD-10-CM | POA: Diagnosis not present

## 2019-11-27 ENCOUNTER — Other Ambulatory Visit: Payer: Self-pay | Admitting: Family Medicine

## 2019-11-27 DIAGNOSIS — I10 Essential (primary) hypertension: Secondary | ICD-10-CM

## 2019-11-27 NOTE — Telephone Encounter (Signed)
Requested Prescriptions  Pending Prescriptions Disp Refills  . hydrALAZINE (APRESOLINE) 25 MG tablet [Pharmacy Med Name: hydrALAZINE HCl 25 MG Oral Tablet] 90 tablet 0    Sig: TAKE 1 TABLET BY MOUTH THREE TIMES DAILY     Cardiovascular:  Vasodilators Passed - 11/27/2019  2:01 PM      Passed - HCT in normal range and within 360 days    HCT  Date Value Ref Range Status  03/03/2019 39.5 35.0 - 45.0 % Final         Passed - HGB in normal range and within 360 days    Hemoglobin  Date Value Ref Range Status  03/03/2019 12.8 11.7 - 15.5 g/dL Final         Passed - RBC in normal range and within 360 days    RBC  Date Value Ref Range Status  03/03/2019 4.81 3.80 - 5.10 Million/uL Final         Passed - WBC in normal range and within 360 days    WBC  Date Value Ref Range Status  03/03/2019 6.2 3.8 - 10.8 Thousand/uL Final         Passed - PLT in normal range and within 360 days    Platelets  Date Value Ref Range Status  03/03/2019 252 140 - 400 Thousand/uL Final         Passed - Last BP in normal range    BP Readings from Last 1 Encounters:  06/09/19 138/78         Passed - Valid encounter within last 12 months    Recent Outpatient Visits          6 months ago Essential hypertension   [place], [place], FNP   7 months ago Essential hypertension   [place], [place], FNP   8 months ago Essential hypertension   [place], FNP   8 months ago Need for Tdap vaccination   [place], [place], FNP   9 months ago Essential hypertension   [place], Astrid Divine, [place]

## 2019-12-06 DIAGNOSIS — Z23 Encounter for immunization: Secondary | ICD-10-CM | POA: Diagnosis not present

## 2019-12-28 ENCOUNTER — Other Ambulatory Visit: Payer: Self-pay | Admitting: Family Medicine

## 2019-12-28 DIAGNOSIS — I10 Essential (primary) hypertension: Secondary | ICD-10-CM

## 2019-12-29 ENCOUNTER — Other Ambulatory Visit: Payer: Self-pay | Admitting: Family Medicine

## 2019-12-29 DIAGNOSIS — I10 Essential (primary) hypertension: Secondary | ICD-10-CM

## 2019-12-30 ENCOUNTER — Other Ambulatory Visit: Payer: Self-pay | Admitting: Family Medicine

## 2019-12-30 DIAGNOSIS — I10 Essential (primary) hypertension: Secondary | ICD-10-CM

## 2020-02-19 DIAGNOSIS — Z961 Presence of intraocular lens: Secondary | ICD-10-CM | POA: Diagnosis not present

## 2020-02-19 DIAGNOSIS — H35033 Hypertensive retinopathy, bilateral: Secondary | ICD-10-CM | POA: Diagnosis not present

## 2020-02-26 ENCOUNTER — Other Ambulatory Visit: Payer: Self-pay | Admitting: Family Medicine

## 2020-02-26 DIAGNOSIS — I10 Essential (primary) hypertension: Secondary | ICD-10-CM

## 2020-02-26 NOTE — Telephone Encounter (Signed)
Requested Prescriptions  Pending Prescriptions Disp Refills  . hydrALAZINE (APRESOLINE) 25 MG tablet [Pharmacy Med Name: hydrALAZINE HCl 25 MG Oral Tablet] 180 tablet 0    Sig: TAKE 1 TABLET BY MOUTH THREE TIMES DAILY     Cardiovascular:  Vasodilators Passed - 02/26/2020  3:24 PM      Passed - HCT in normal range and within 360 days    HCT  Date Value Ref Range Status  03/03/2019 39.5 35.0 - 45.0 % Final         Passed - HGB in normal range and within 360 days    Hemoglobin  Date Value Ref Range Status  03/03/2019 12.8 11.7 - 15.5 g/dL Final         Passed - RBC in normal range and within 360 days    RBC  Date Value Ref Range Status  03/03/2019 4.81 3.80 - 5.10 Million/uL Final         Passed - WBC in normal range and within 360 days    WBC  Date Value Ref Range Status  03/03/2019 6.2 3.8 - 10.8 Thousand/uL Final         Passed - PLT in normal range and within 360 days    Platelets  Date Value Ref Range Status  03/03/2019 252 140 - 400 Thousand/uL Final         Passed - Last BP in normal range    BP Readings from Last 1 Encounters:  06/09/19 138/78         Passed - Valid encounter within last 12 months    Recent Outpatient Visits          9 months ago Essential hypertension   [place], [place], FNP   10 months ago Essential hypertension   [place], [place], [place]   11 months ago Essential hypertension   [place], FNP   12 months ago Need for Tdap vaccination   [place], [place], FNP   12 months ago Essential hypertension   [place], Astrid Divine, [place]

## 2020-03-14 ENCOUNTER — Other Ambulatory Visit: Payer: Self-pay | Admitting: Family Medicine

## 2020-03-14 DIAGNOSIS — E782 Mixed hyperlipidemia: Secondary | ICD-10-CM

## 2020-03-14 NOTE — Telephone Encounter (Signed)
Medication Refill - Medication: rosuvastatin (CRESTOR) 20 MG tablet [id number]  Pt stated she called this in on this past Monday   Has the patient contacted their pharmacy? Yes.   (Agent: If no, request that the patient contact the pharmacy for the refill.) (Agent: If yes, when and what did the pharmacy advise?)  Preferred Pharmacy (with phone number or street name): [facility] on [address]   Agent: Please be advised that RX refills may take up to 3 business days. We ask that you follow-up with your pharmacy.

## 2020-03-19 NOTE — Telephone Encounter (Signed)
Order sent for refill 

## 2020-03-20 NOTE — Telephone Encounter (Signed)
Patient was seen in office today.  

## 2020-04-04 ENCOUNTER — Ambulatory Visit (INDEPENDENT_AMBULATORY_CARE_PROVIDER_SITE_OTHER): Payer: Medicare Other | Admitting: Family Medicine

## 2020-04-04 ENCOUNTER — Encounter: Payer: Self-pay | Admitting: Family Medicine

## 2020-04-04 ENCOUNTER — Other Ambulatory Visit: Payer: Self-pay

## 2020-04-04 VITALS — BP 138/76 | HR 92 | Temp 97.8°F | Resp 16 | Ht 66.0 in | Wt 172.5 lb

## 2020-04-04 DIAGNOSIS — N189 Chronic kidney disease, unspecified: Secondary | ICD-10-CM | POA: Diagnosis not present

## 2020-04-04 DIAGNOSIS — I10 Essential (primary) hypertension: Secondary | ICD-10-CM | POA: Diagnosis not present

## 2020-04-04 DIAGNOSIS — E782 Mixed hyperlipidemia: Secondary | ICD-10-CM | POA: Diagnosis not present

## 2020-04-04 DIAGNOSIS — R809 Proteinuria, unspecified: Secondary | ICD-10-CM

## 2020-04-04 LAB — COMPLETE METABOLIC PANEL WITH GFR
AG Ratio: 1.4 (calc) (ref 1.0–2.5)
ALT: 18 U/L (ref 6–29)
AST: 14 U/L (ref 10–35)
Albumin: 4.3 g/dL (ref 3.6–5.1)
Alkaline phosphatase (APISO): 69 U/L (ref 37–153)
BUN: 14 mg/dL (ref 7–25)
CO2: 32 mmol/L (ref 20–32)
Calcium: 9.7 mg/dL (ref 8.6–10.4)
Chloride: 105 mmol/L (ref 98–110)
Creat: 0.74 mg/dL (ref 0.60–0.93)
GFR, Est African American: 93 mL/min/{1.73_m2} (ref >=60)
GFR, Est Non African American: 80 mL/min/{1.73_m2} (ref >=60)
Globulin: 3 g/dL (calc) (ref 1.9–3.7)
Glucose, Bld: 160 mg/dL — ABNORMAL HIGH (ref 65–99)
Potassium: 4.5 mmol/L (ref 3.5–5.3)
Sodium: 143 mmol/L (ref 135–146)
Total Bilirubin: 0.4 mg/dL (ref 0.2–1.2)
Total Protein: 7.3 g/dL (ref 6.1–8.1)

## 2020-04-04 LAB — LIPID PANEL
Cholesterol: 221 mg/dL — ABNORMAL HIGH (ref <200)
HDL: 53 mg/dL (ref >=50)
LDL Cholesterol (Calc): 147 mg/dL (calc) — ABNORMAL HIGH
Non-HDL Cholesterol (Calc): 168 mg/dL (calc) — ABNORMAL HIGH (ref <130)
Total CHOL/HDL Ratio: 4.2 (calc) (ref <5.0)
Triglycerides: 100 mg/dL (ref <150)

## 2020-04-04 MED ORDER — AMLODIPINE BESYLATE 10 MG PO TABS: 10.0000 mg | ORAL_TABLET | Freq: Every day | ORAL | 3 refills | Status: DC

## 2020-04-04 MED ORDER — ROSUVASTATIN CALCIUM 20 MG PO TABS: 20.0000 mg | ORAL_TABLET | Freq: Every day | ORAL | 3 refills | Status: DC

## 2020-04-04 MED ORDER — HYDRALAZINE HCL 25 MG PO TABS: 25.0000 mg | ORAL_TABLET | Freq: Three times a day (TID) | ORAL | 3 refills | Status: DC

## 2020-04-04 MED ORDER — VALSARTAN 160 MG PO TABS: 160.0000 mg | ORAL_TABLET | Freq: Every day | ORAL | 3 refills | Status: DC

## 2020-04-04 NOTE — Progress Notes (Signed)
Name: Jeanne Hickman   MRN: [id number]    DOB: 1946/06/13   Date:04/04/2020       Progress Note  Chief Complaint  Patient presents with  . Hypertension    follow  up  . Hyperlipidemia     Subjective:   Jeanne Hickman is a 74 y.o. female, presents to clinic for   HTN - on amlodipine, hydralazine and valsartan -compliant with medications, no side effects or concerns BP Readings from Last 3 Encounters:  04/04/20 138/76  06/09/19 138/78  05/15/19 (!) 150/63  Sees nephrology - last labs and OV reviewed   Hyperlipidemia: Currently treated with crestor 20 mg, pt reports good med compliance, patient established at this practice last May, a little over a year ago she has been compliant with statin medication, labs done prior to starting Crestor show elevated LDL, she has not been in for a recheck of labs since starting statin medication but she has done virtual follow-ups and has not had any difficulty with medication side effects or myalgias. Last Lipids: Lab Results  Component Value Date   CHOL 240 (H) 03/03/2019   HDL 59 03/03/2019   LDLCALC 155 (H) 03/03/2019   TRIG 133 03/03/2019   CHOLHDL 4.1 03/03/2019   - Denies: Chest pain, shortness of breath, myalgias, claudication  CKD-  She was diagnosed with essential hypertension about a year ago and had noted proteinuria and hypokalemia, was referred to Dr. Candiss Norse, nephrology, only office visit that I can find to review today was from last July 10, 2019.   HCTZ was stopped due to abnormal potassium, patient is no longer on amiloride.  She has not been back for follow-up though at her appointment in September they did request a 5-month follow-up.  Today patient states that they told her they would call her when they want to see her.  They have been refilling her valsartan for her about every 3 months.  Renal function recent labs:  Lab Results  Component Value Date   GFRAA 77 03/03/2019    Lab Results    Component Value Date   CREATININE 0.87 03/03/2019   BUN 23 03/03/2019   NA 141 03/03/2019   K 3.5 03/03/2019   CL 100 03/03/2019   CO2 32 03/03/2019   Most recent labs from nephrology were on 07/10/2019 with slightly elevated blood glucose, creatinine 0.92, GFR 72 and per Dr. Keturah Barre documentation minimal proteinuria.    Current Outpatient Medications:  .  amLODipine (NORVASC) 10 MG tablet, Take 1 tablet (10 mg total) by mouth daily., Disp: 90 tablet, Rfl: 3 .  hydrALAZINE (APRESOLINE) 25 MG tablet, TAKE 1 TABLET BY MOUTH THREE TIMES DAILY, Disp: 180 tablet, Rfl: 0 .  Multiple Vitamin (MULITIVITAMIN WITH MINERALS) TABS, Take 1 tablet by mouth daily., Disp: , Rfl:  .  rosuvastatin (CRESTOR) 20 MG tablet, Take 1 tablet (20 mg total) by mouth daily., Disp: 90 tablet, Rfl: 3 .  valsartan (DIOVAN) 160 MG tablet, Take 160 mg by mouth daily., Disp: , Rfl:  .  aMILoride (MIDAMOR) 5 MG tablet, Take 5 mg by mouth every other day. (Patient not taking: Reported on 04/04/2020), Disp: , Rfl:   Patient Active Problem List   Diagnosis Date Noted  . Essential hypertension 03/17/2019  . Mixed hyperlipidemia 03/17/2019  . Albuminuria 03/17/2019  . Cataract     Past Surgical History:  Procedure Laterality Date  . ABDOMINAL HYSTERECTOMY      No family history on file.  Social  History   Tobacco Use  . Smoking status: Former Smoker    Types: Cigarettes    Quit date: 02/28/1978    Years since quitting: 42.1  . Smokeless tobacco: Never Used  Vaping Use  . Vaping Use: Never used  Substance Use Topics  . Alcohol use: No  . Drug use: No     No Known Allergies  Health Maintenance  Topic Date Due  . DEXA SCAN  Never done  . MAMMOGRAM  Never done  . COLONOSCOPY  Never done  . PNA vac Low Risk Adult (2 of 2 - PPSV23) 03/05/2020  . INFLUENZA VACCINE  05/19/2020  . TETANUS/TDAP  03/02/2029  . COVID-19 Vaccine  Completed  . Hepatitis C Screening  Completed    Chart Review Today: I  personally reviewed active problem list, medication list, allergies, family history, social history, health maintenance, notes from last encounter, lab results, imaging with the patient/caregiver today.   Review of Systems  10 Systems reviewed and are negative for acute change except as noted in the HPI.  Objective:   Vitals:   04/04/20 0914  BP: 138/76  Pulse: 92  Resp: 16  Temp: 97.8 F (36.6 C)  TempSrc: Temporal  SpO2: 95%  Weight: 172 lb 8 oz (78.2 kg)  Height: 5\' 6"  (1.676 m)    Body mass index is 27.84 kg/m.  Physical Exam Vitals and nursing note reviewed.  Constitutional:      General: She is not in acute distress.    Appearance: Normal appearance. She is well-developed. She is not ill-appearing, toxic-appearing or diaphoretic.     Interventions: Face mask in place.     Comments: Well-appearing female appears younger than stated age  HENT:     Head: Normocephalic and atraumatic.     Right Ear: External ear normal.     Left Ear: External ear normal.  Eyes:     General: Lids are normal. No scleral icterus.       Right eye: No discharge.        Left eye: No discharge.     Conjunctiva/sclera: Conjunctivae normal.  Neck:     Trachea: Phonation normal. No tracheal deviation.  Cardiovascular:     Rate and Rhythm: Normal rate and regular rhythm.     Pulses: Normal pulses.          Radial pulses are 2+ on the right side and 2+ on the left side.       Posterior tibial pulses are 2+ on the right side and 2+ on the left side.     Heart sounds: Normal heart sounds. No murmur heard.  No friction rub. No gallop.   Pulmonary:     Effort: Pulmonary effort is normal. No respiratory distress.     Breath sounds: Normal breath sounds. No stridor. No wheezing, rhonchi or rales.  Chest:     Chest wall: No tenderness.  Abdominal:     General: Bowel sounds are normal.     Palpations: Abdomen is soft.  Musculoskeletal:     Cervical back: Normal range of motion.     Right  lower leg: No edema.     Left lower leg: No edema.  Skin:    General: Skin is warm and dry.     Coloration: Skin is not jaundiced or pale.     Findings: No rash.  Neurological:     Mental Status: She is alert and oriented to person, place, and time.  Motor: No abnormal muscle tone.     Gait: Gait normal.  Psychiatric:        Mood and Affect: Mood normal.        Speech: Speech normal.        Behavior: Behavior normal.         Assessment & Plan:   1. Essential hypertension Blood pressure stable and at goal today for age (<140/90) good med compliance, due for repeat labs Diagnosed by nephrology with chronic kidney disease and mild proteinuria which they are monitoring, history of abnormal calcium, she has discontinued amiloride, last labs show good GFR, sCr and electrolytes, will recheck today, all meds refills today with one year supply.   Pt was encouraged to be compliant with her nephrology follow-up - amLODipine (NORVASC) 10 MG tablet; Take 1 tablet (10 mg total) by mouth daily.  Dispense: 90 tablet; Refill: 3 - hydrALAZINE (APRESOLINE) 25 MG tablet; Take 1 tablet (25 mg total) by mouth 3 (three) times daily.  Dispense: 180 tablet; Refill: 3 - COMPLETE METABOLIC PANEL WITH GFR - valsartan (DIOVAN) 160 MG tablet; Take 1 tablet (160 mg total) by mouth daily.  Dispense: 90 tablet; Refill: 3  2. Mixed hyperlipidemia Fairly elevated cholesterol first diagnosed and treatment initiated about 1 year ago.  She has done virtual follow-ups with her PCP but had not come back into clinic to recheck lab work so we will do that today. Compliant with meds, no SE, no myalgias, fatigue or jaundice Due for lipid panel and recheck CMP I did explain to the patient that if her cholesterol panel is well controlled we will monitor her once yearly if there are med changes or poor control we will may need to check more frequently - Lipid panel - rosuvastatin (CRESTOR) 20 MG tablet; Take 1 tablet (20  mg total) by mouth daily.  Dispense: 90 tablet; Refill: 3 - COMPLETE METABOLIC PANEL WITH GFR  3. Proteinuria, unspecified type Per nephrology, noted previously to be mild, nephrology adjusted medications about 9 months ago, electrolytes have been normal, renal function has been excellent and stable and blood pressures stable and well-controlled as well.  We will recheck basic labs with patient was encouraged to follow-up with nephrology until they release her to PCP management only.   Return in about 6 months (around 10/04/2020) for Routine follow-up.   Delsa Grana, PA-C 04/04/20 9:26 AM

## 2020-04-08 ENCOUNTER — Telehealth: Payer: Self-pay

## 2020-04-08 DIAGNOSIS — R739 Hyperglycemia, unspecified: Secondary | ICD-10-CM

## 2020-04-08 NOTE — Telephone Encounter (Signed)
-----   Message from Delsa Grana, [place] sent at 04/05/2020  5:24 PM EDT ----- Please add on A1C for hyperglycemia-  We will call pt later when we get labs added on and results

## 2020-04-09 ENCOUNTER — Telehealth: Payer: Self-pay

## 2020-04-09 MED ORDER — ROSUVASTATIN CALCIUM 40 MG PO TABS: 40.0000 mg | ORAL_TABLET | Freq: Every day | ORAL | 1 refills | Status: DC

## 2020-04-09 NOTE — Telephone Encounter (Signed)
-----   Message from Delsa Grana, [place] sent at 04/09/2020  7:47 AM EDT ----- Cholesterol is still very high, please cancel her Crestor 20 on chart, send in crestor 40 mg #90 with 1 refill (pt can take 2 pills till her current supply is done) recheck CMP, A1C and lipid in 3-4 months with f/up OV please

## 2020-10-02 ENCOUNTER — Other Ambulatory Visit: Payer: Self-pay

## 2020-10-02 ENCOUNTER — Ambulatory Visit (INDEPENDENT_AMBULATORY_CARE_PROVIDER_SITE_OTHER): Payer: Medicare Other | Admitting: Family Medicine

## 2020-10-02 ENCOUNTER — Encounter: Payer: Self-pay | Admitting: Family Medicine

## 2020-10-02 ENCOUNTER — Telehealth: Payer: Self-pay | Admitting: *Deleted

## 2020-10-02 VITALS — BP 138/82 | HR 83 | Temp 98.3°F | Resp 16 | Ht 66.0 in | Wt 170.3 lb

## 2020-10-02 DIAGNOSIS — R809 Proteinuria, unspecified: Secondary | ICD-10-CM | POA: Diagnosis not present

## 2020-10-02 DIAGNOSIS — E782 Mixed hyperlipidemia: Secondary | ICD-10-CM

## 2020-10-02 DIAGNOSIS — R739 Hyperglycemia, unspecified: Secondary | ICD-10-CM

## 2020-10-02 DIAGNOSIS — I10 Essential (primary) hypertension: Secondary | ICD-10-CM | POA: Diagnosis not present

## 2020-10-02 DIAGNOSIS — Z780 Asymptomatic menopausal state: Secondary | ICD-10-CM

## 2020-10-02 DIAGNOSIS — Z5181 Encounter for therapeutic drug level monitoring: Secondary | ICD-10-CM | POA: Diagnosis not present

## 2020-10-02 DIAGNOSIS — Z1231 Encounter for screening mammogram for malignant neoplasm of breast: Secondary | ICD-10-CM | POA: Diagnosis not present

## 2020-10-02 DIAGNOSIS — L989 Disorder of the skin and subcutaneous tissue, unspecified: Secondary | ICD-10-CM | POA: Diagnosis not present

## 2020-10-02 DIAGNOSIS — N189 Chronic kidney disease, unspecified: Secondary | ICD-10-CM

## 2020-10-02 DIAGNOSIS — Z23 Encounter for immunization: Secondary | ICD-10-CM

## 2020-10-02 MED ORDER — ZOSTER VAC RECOMB ADJUVANTED 50 MCG/0.5ML IM SUSR
0.5000 mL | Freq: Once | INTRAMUSCULAR | 1 refills | Status: AC
Start: 2020-10-02 — End: 2020-10-02

## 2020-10-02 MED ORDER — EZETIMIBE 10 MG PO TABS: 10.0000 mg | ORAL_TABLET | Freq: Every day | ORAL | 3 refills | Status: DC

## 2020-10-02 NOTE — Chronic Care Management (AMB) (Signed)
°  Chronic Care Management   Outreach Note  10/02/2020 Name: Jeanne Hickman MRN: [id number] DOB: 1946/08/06  Jeanne Hickman is a 74 y.o. year old female who is a primary care patient of Delsa Grana, [place]. I reached out to Jeanne Hickman by phone today in response to a referral sent by Jeanne Hickman's health plan.     An unsuccessful telephone outreach was attempted today. The patient was referred to the case management team for assistance with care management and care coordination.   Follow Up Plan: A HIPAA compliant phone message was left for the patient providing contact information and requesting a return call. The care management team will reach out to the patient again over the next 7 days. If patient returns call to provider office, please advise to call [place] at [phone].  [place] Management  Direct Dial: [phone]

## 2020-10-02 NOTE — Patient Instructions (Signed)
I will call you with results from your labs and let you know what blood pressure and kidney medicines you need to keep taking.  Shingrix shot was sent to your pharmacy and you can get it there when you would like to  Stop crestor  You may want to try zetia - a different kind of medicine that may help lower your cholesterol a little

## 2020-10-02 NOTE — Progress Notes (Signed)
Name: Jeanne Hickman   MRN: [id number]    DOB: Dec 10, 1945   Date:10/02/2020       Progress Note  Chief Complaint  Patient presents with  . Hyperlipidemia  . Hypertension     Subjective:   Jeanne Hickman is a 74 y.o. female, presents to clinic for routine follow-up  HLD- Hyperlipidemia: Currently treated with crestor - tried two different doses and she had severe pain and could not walk she states its "the devil" and "worse than natural child birth" could not tolerate  Last Lipids: Lab Results  Component Value Date   CHOL 221 (H) 04/04/2020   HDL 53 04/04/2020   LDLCALC 147 (H) 04/04/2020   TRIG 100 04/04/2020   CHOLHDL 4.2 04/04/2020  The 10-year ASCVD risk score Mikey Bussing DC Jr., et al., 2013) is: 17.6%   Values used to calculate the score:     Age: 64 years     Sex: Female     Is Non-Hispanic African American: Yes     Diabetic: No     Tobacco smoker: No     Systolic Blood Pressure: 248 mmHg     Is BP treated: Yes     HDL Cholesterol: 53 mg/dL     Total Cholesterol: 221 mg/dL  - Denies: Chest pain, shortness of breath, myalgias, claudication  Hypertension:  Currently managed on valsartan, hydralazine 25 mg TID and amlodipine 10 mg but she hasn't been taking amlodipine Lately  Reports recently she has increased stress and BP monitoring is higher than her normal 120-130's usually when monitoring at home, recently have been a little higher with stress with husbands health   Pt reports good med compliance (other than amlodipine - and denies any SE.   Blood pressure today is fairly well controlled. BP Readings from Last 3 Encounters:  10/02/20 138/82  04/04/20 138/76  06/09/19 138/78   Pt denies CP, SOB, exertional sx, LE edema, palpitation, Ha's, visual disturbances, lightheadedness, hypotension, syncope. D   Hx of hypokalemia, proteinuria and HTN Previously pt went to nephrology for this and HCTZ was switched to amlodipine Potassium levels stayed  normal after med changes and there was minimal proteinuria, she has not been back to nephrology since the initial office visit September 2020 but Dr. Candiss Norse has refilled the medications a few times this year        Current Outpatient Medications:  .  hydrALAZINE (APRESOLINE) 25 MG tablet, Take 1 tablet (25 mg total) by mouth 3 (three) times daily., Disp: 180 tablet, Rfl: 3 .  Multiple Vitamin (MULITIVITAMIN WITH MINERALS) TABS, Take 1 tablet by mouth daily., Disp: , Rfl:  .  valsartan (DIOVAN) 160 MG tablet, Take 1 tablet (160 mg total) by mouth daily., Disp: 90 tablet, Rfl: 3 .  amLODipine (NORVASC) 10 MG tablet, Take 1 tablet (10 mg total) by mouth daily. (Patient not taking: Reported on 10/02/2020), Disp: 90 tablet, Rfl: 3 .  rosuvastatin (CRESTOR) 40 MG tablet, Take 1 tablet (40 mg total) by mouth daily. (Patient not taking: Reported on 10/02/2020), Disp: 90 tablet, Rfl: 1  Patient Active Problem List   Diagnosis Date Noted  . Chronic kidney disease (CKD) 04/04/2020  . Essential hypertension 03/17/2019  . Mixed hyperlipidemia 03/17/2019  . Proteinuria 03/17/2019  . Cataract     Past Surgical History:  Procedure Laterality Date  . ABDOMINAL HYSTERECTOMY      No family history on file.  Social History   Tobacco Use  . Smoking status: Former  Smoker    Types: Cigarettes    Quit date: 02/28/1978    Years since quitting: 42.6  . Smokeless tobacco: Never Used  Vaping Use  . Vaping Use: Never used  Substance Use Topics  . Alcohol use: No  . Drug use: No     No Known Allergies  Health Maintenance  Topic Date Due  . DEXA SCAN  Never done  . MAMMOGRAM  Never done  . COLONOSCOPY  Never done  . COVID-19 Vaccine (3 - Booster for Pfizer series) 06/04/2020  . TETANUS/TDAP  03/02/2029  . INFLUENZA VACCINE  Completed  . Hepatitis C Screening  Completed  . PNA vac Low Risk Adult  Completed    Chart Review Today: I personally reviewed active problem list, medication list,  allergies, family history, social history, health maintenance, notes from last encounter, lab results, imaging with the patient/caregiver today.   Review of Systems  10 Systems reviewed and are negative for acute change except as noted in the HPI.  Objective:   Vitals:   10/02/20 0835  BP: 138/82  Pulse: 83  Resp: 16  Temp: 98.3 F (36.8 C)  TempSrc: Oral  SpO2: 96%  Weight: 170 lb 4.8 oz (77.2 kg)  Height: 5\' 6"  (1.676 m)    Body mass index is 27.49 kg/m.  Physical Exam Vitals and nursing note reviewed.  Constitutional:      General: She is not in acute distress.    Appearance: Normal appearance. She is well-developed. She is not ill-appearing, toxic-appearing or diaphoretic.     Interventions: Face mask in place.  HENT:     Head: Normocephalic and atraumatic.     Right Ear: External ear normal.     Left Ear: External ear normal.  Eyes:     General: Lids are normal. No scleral icterus.       Right eye: No discharge.        Left eye: No discharge.     Conjunctiva/sclera: Conjunctivae normal.  Neck:     Trachea: Phonation normal. No tracheal deviation.  Cardiovascular:     Rate and Rhythm: Normal rate and regular rhythm.     Pulses: Normal pulses.          Radial pulses are 2+ on the right side and 2+ on the left side.       Posterior tibial pulses are 2+ on the right side and 2+ on the left side.     Heart sounds: Normal heart sounds. No murmur heard. No friction rub. No gallop.   Pulmonary:     Effort: Pulmonary effort is normal. No respiratory distress.     Breath sounds: Normal breath sounds. No stridor. No wheezing, rhonchi or rales.  Chest:     Chest wall: No tenderness.  Abdominal:     General: Bowel sounds are normal. There is no distension.     Palpations: Abdomen is soft.  Musculoskeletal:     Right lower leg: No edema.     Left lower leg: No edema.  Skin:    General: Skin is warm and dry.     Coloration: Skin is not jaundiced or pale.      Findings: Lesion (darker brown oval lesion to right cheek, raised, slightly velvety in appearance) present. No rash.  Neurological:     Mental Status: She is alert. Mental status is at baseline.     Motor: No abnormal muscle tone.     Gait: Gait normal.  Psychiatric:  Mood and Affect: Mood normal.        Speech: Speech normal.        Behavior: Behavior normal.         Assessment & Plan:     ICD-10-CM   1. Essential hypertension  P03 COMPLETE METABOLIC PANEL WITH GFR   Elevated slightly which patient attributes to stress due for labs, continue valsartan and hydralazine, likely needs to resume amlodipine 10 mg daily  2. Mixed hyperlipidemia  E03.5 COMPLETE METABOLIC PANEL WITH GFR    ezetimibe (ZETIA) 10 MG tablet   She cannot tolerate statin due to severe side effects and myalgias, recheck labs, consider Zetia, continue to work on diet and lifestyle  3. Need for vaccination  Z23 Pneumococcal polysaccharide vaccine 23-valent greater than or equal to 2yo subcutaneous/IM    Zoster Vaccine Adjuvanted [facility]) injection  4. Hyperglycemia  C48.1 COMPLETE METABOLIC PANEL WITH GFR   Labs in the past showed some elevated blood sugars, will add on A1c if still elevated with labs  5. Proteinuria, unspecified type  Y59.0 COMPLETE METABOLIC PANEL WITH GFR    Microalbumin, urine   Monitoring proteinuria  6. Chronic kidney disease, unspecified CKD stage  B31.1 COMPLETE METABOLIC PANEL WITH GFR   Monitor renal function, managing blood pressure, avoiding NSAIDs and remaining hydrated was reviewed with the patient  7. Encounter for screening mammogram for malignant neoplasm of breast  Z12.31 MM 3D SCREEN BREAST BILATERAL   Mammogram ordered  8. Postmenopausal estrogen deficiency  Z78.0 DG Bone Density   DEXA ordered  9. Lesion of face  L98.9 Ambulatory referral to Dermatology   Raised hyper pigmented lesions to face she would like to see dermatology  10. Encounter for medication monitoring   Z51.81 CBC with Differential/Platelet    COMPLETE METABOLIC PANEL WITH GFR    Microalbumin, urine     Return in about 6 months (around 04/02/2021) for Routine follow-up.   Delsa Grana, PA-C 10/02/20 8:49 AM

## 2020-10-04 ENCOUNTER — Other Ambulatory Visit: Payer: Self-pay | Admitting: Family Medicine

## 2020-10-04 ENCOUNTER — Encounter: Payer: Self-pay | Admitting: Family Medicine

## 2020-10-04 DIAGNOSIS — I10 Essential (primary) hypertension: Secondary | ICD-10-CM

## 2020-10-04 MED ORDER — AMLODIPINE BESYLATE 10 MG PO TABS: 10.0000 mg | ORAL_TABLET | Freq: Every day | ORAL | 3 refills | Status: DC

## 2020-10-07 NOTE — Chronic Care Management (AMB) (Signed)
°  Chronic Care Management   Outreach Note  10/07/2020 Name: Jeanne Hickman MRN: [id number] DOB: [birth year]  Jeanne Hickman is a 74 y.o. year old female who is a primary care patient of Delsa Grana, [place]. I reached out to Jeanne Hickman by phone today in response to a referral sent by Jeanne Hickman health plan.     A second unsuccessful telephone outreach was attempted today. The patient was referred to the case management team for assistance with care management and care coordination.   Follow Up Plan: A HIPAA compliant phone message was left for the patient providing contact information and requesting a return call. The care management team will reach out to the patient again over the next 7 days. If patient returns call to provider office, please advise to call [place] at [phone].  [place] Management  Direct Dial: [phone]

## 2020-10-08 ENCOUNTER — Ambulatory Visit (INDEPENDENT_AMBULATORY_CARE_PROVIDER_SITE_OTHER): Payer: Medicare Other

## 2020-10-08 ENCOUNTER — Encounter: Payer: Self-pay | Admitting: Family Medicine

## 2020-10-08 DIAGNOSIS — E119 Type 2 diabetes mellitus without complications: Secondary | ICD-10-CM | POA: Insufficient documentation

## 2020-10-08 DIAGNOSIS — E1129 Type 2 diabetes mellitus with other diabetic kidney complication: Secondary | ICD-10-CM | POA: Insufficient documentation

## 2020-10-08 DIAGNOSIS — Z1211 Encounter for screening for malignant neoplasm of colon: Secondary | ICD-10-CM | POA: Diagnosis not present

## 2020-10-08 DIAGNOSIS — Z0000 Encounter for general adult medical examination without abnormal findings: Secondary | ICD-10-CM | POA: Diagnosis not present

## 2020-10-08 LAB — CBC WITH DIFFERENTIAL/PLATELET
Absolute Monocytes: 443 cells/uL (ref 200–950)
Basophils Absolute: 41 cells/uL (ref 0–200)
Basophils Relative: 0.7 %
Eosinophils Absolute: 53 cells/uL (ref 15–500)
Eosinophils Relative: 0.9 %
HCT: 38.7 % (ref 35.0–45.0)
Hemoglobin: 12.5 g/dL (ref 11.7–15.5)
Lymphs Abs: 1670 cells/uL (ref 850–3900)
MCH: 26.8 pg — ABNORMAL LOW (ref 27.0–33.0)
MCHC: 32.3 g/dL (ref 32.0–36.0)
MCV: 83 fL (ref 80.0–100.0)
MPV: 10.7 fL (ref 7.5–12.5)
Monocytes Relative: 7.5 %
Neutro Abs: 3693 cells/uL (ref 1500–7800)
Neutrophils Relative %: 62.6 %
Platelets: 296 10*3/uL (ref 140–400)
RBC: 4.66 10*6/uL (ref 3.80–5.10)
RDW: 13.8 % (ref 11.0–15.0)
Total Lymphocyte: 28.3 %
WBC: 5.9 10*3/uL (ref 3.8–10.8)

## 2020-10-08 LAB — COMPLETE METABOLIC PANEL WITH GFR
AG Ratio: 1.4 (calc) (ref 1.0–2.5)
ALT: 18 U/L (ref 6–29)
AST: 16 U/L (ref 10–35)
Albumin: 4.2 g/dL (ref 3.6–5.1)
Alkaline phosphatase (APISO): 70 U/L (ref 37–153)
BUN: 15 mg/dL (ref 7–25)
CO2: 31 mmol/L (ref 20–32)
Calcium: 9.3 mg/dL (ref 8.6–10.4)
Chloride: 103 mmol/L (ref 98–110)
Creat: 0.73 mg/dL (ref 0.60–0.93)
GFR, Est African American: 94 mL/min/{1.73_m2} (ref >=60)
GFR, Est Non African American: 81 mL/min/{1.73_m2} (ref >=60)
Globulin: 2.9 g/dL (calc) (ref 1.9–3.7)
Glucose, Bld: 113 mg/dL — ABNORMAL HIGH (ref 65–99)
Potassium: 3.8 mmol/L (ref 3.5–5.3)
Sodium: 141 mmol/L (ref 135–146)
Total Bilirubin: 0.6 mg/dL (ref 0.2–1.2)
Total Protein: 7.1 g/dL (ref 6.1–8.1)

## 2020-10-08 LAB — MICROALBUMIN, URINE: Microalb, Ur: 28.1 mg/dL

## 2020-10-08 LAB — HEMOGLOBIN A1C
Hgb A1c MFr Bld: 6.6 % of total Hgb — ABNORMAL HIGH (ref <5.7)
Mean Plasma Glucose: 143 mg/dL
eAG (mmol/L): 7.9 mmol/L

## 2020-10-08 LAB — TEST AUTHORIZATION

## 2020-10-08 NOTE — Patient Instructions (Signed)
Jeanne Hickman , Thank you for taking time to come for your Medicare Wellness Visit. I appreciate your ongoing commitment to your health goals. Please review the following plan we discussed and let me know if I can assist you in the future.   Screening recommendations/referrals: Colonoscopy: due; Referral sent to [facility] today. They will contact you for an appointment for screening colonoscopy.  Mammogram: Please call [phone] to schedule your mammogram and bone density screening.   Recommended yearly ophthalmology/optometry visit for glaucoma screening and checkup Recommended yearly dental visit for hygiene and checkup  Vaccinations: Influenza vaccine: done 08/08/20 Pneumococcal vaccine: done 10/02/20 Tdap vaccine: done 03/03/19 Shingles vaccine: Shingrix discussed. Please contact your pharmacy for coverage information.  Covid-19: done 11/15/19, 12/06/19 & 08/08/20  Advanced directives: Please bring a copy of your health care power of attorney and living will to the office at your convenience.  Conditions/risks identified: Recommend increasing physical activity to at least 3 days per week   Next appointment: Follow up in one year for your annual wellness visit    Preventive Care 65 Years and Older, Female Preventive care refers to lifestyle choices and visits with your health care provider that can promote health and wellness. What does preventive care include?  A yearly physical exam. This is also called an annual well check.  Dental exams once or twice a year.  Routine eye exams. Ask your health care provider how often you should have your eyes checked.  Personal lifestyle choices, including:  Daily care of your teeth and gums.  Regular physical activity.  Eating a healthy diet.  Avoiding tobacco and drug use.  Limiting alcohol use.  Practicing safe sex.  Taking low-dose aspirin every day.  Taking vitamin and mineral supplements as recommended by  your health care provider. What happens during an annual well check? The services and screenings done by your health care provider during your annual well check will depend on your age, overall health, lifestyle risk factors, and family history of disease. Counseling  Your health care provider may ask you questions about your:  Alcohol use.  Tobacco use.  Drug use.  Emotional well-being.  Home and relationship well-being.  Sexual activity.  Eating habits.  History of falls.  Memory and ability to understand (cognition).  Work and work Statistician.  Reproductive health. Screening  You may have the following tests or measurements:  Height, weight, and BMI.  Blood pressure.  Lipid and cholesterol levels. These may be checked every 5 years, or more frequently if you are over 45 years old.  Skin check.  Lung cancer screening. You may have this screening every year starting at age 43 if you have a 30-pack-year history of smoking and currently smoke or have quit within the past 15 years.  Fecal occult blood test (FOBT) of the stool. You may have this test every year starting at age 18.  Flexible sigmoidoscopy or colonoscopy. You may have a sigmoidoscopy every 5 years or a colonoscopy every 10 years starting at age 76.  Hepatitis C blood test.  Hepatitis B blood test.  Sexually transmitted disease (STD) testing.  Diabetes screening. This is done by checking your blood sugar (glucose) after you have not eaten for a while (fasting). You may have this done every 1-3 years.  Bone density scan. This is done to screen for osteoporosis. You may have this done starting at age 96.  Mammogram. This may be done every 1-2 years. Talk to your health care provider about  how often you should have regular mammograms. Talk with your health care provider about your test results, treatment options, and if necessary, the need for more tests. Vaccines  Your health care provider may  recommend certain vaccines, such as:  Influenza vaccine. This is recommended every year.  Tetanus, diphtheria, and acellular pertussis (Tdap, Td) vaccine. You may need a Td booster every 10 years.  Zoster vaccine. You may need this after age 34.  Pneumococcal 13-valent conjugate (PCV13) vaccine. One dose is recommended after age 58.  Pneumococcal polysaccharide (PPSV23) vaccine. One dose is recommended after age 23. Talk to your health care provider about which screenings and vaccines you need and how often you need them. This information is not intended to replace advice given to you by your health care provider. Make sure you discuss any questions you have with your health care provider. Document Released: 11/01/2015 Document Revised: 06/24/2016 Document Reviewed: 08/06/2015 Elsevier Interactive Patient Education  2017 [place] Prevention in the Home Falls can cause injuries. They can happen to people of all ages. There are many things you can do to make your home safe and to help prevent falls. What can I do on the outside of my home?  Regularly fix the edges of walkways and driveways and fix any cracks.  Remove anything that might make you trip as you walk through a door, such as a raised step or threshold.  Trim any bushes or trees on the path to your home.  Use bright outdoor lighting.  Clear any walking paths of anything that might make someone trip, such as rocks or tools.  Regularly check to see if handrails are loose or broken. Make sure that both sides of any steps have handrails.  Any raised decks and porches should have guardrails on the edges.  Have any leaves, snow, or ice cleared regularly.  Use sand or salt on walking paths during winter.  Clean up any spills in your garage right away. This includes oil or grease spills. What can I do in the bathroom?  Use night lights.  Install grab bars by the toilet and in the tub and shower. Do not use towel  bars as grab bars.  Use non-skid mats or decals in the tub or shower.  If you need to sit down in the shower, use a plastic, non-slip stool.  Keep the floor dry. Clean up any water that spills on the floor as soon as it happens.  Remove soap buildup in the tub or shower regularly.  Attach bath mats securely with double-sided non-slip rug tape.  Do not have throw rugs and other things on the floor that can make you trip. What can I do in the bedroom?  Use night lights.  Make sure that you have a light by your bed that is easy to reach.  Do not use any sheets or blankets that are too big for your bed. They should not hang down onto the floor.  Have a firm chair that has side arms. You can use this for support while you get dressed.  Do not have throw rugs and other things on the floor that can make you trip. What can I do in the kitchen?  Clean up any spills right away.  Avoid walking on wet floors.  Keep items that you use a lot in easy-to-reach places.  If you need to reach something above you, use a strong step stool that has a grab bar.  Keep  electrical cords out of the way.  Do not use floor polish or wax that makes floors slippery. If you must use wax, use non-skid floor wax.  Do not have throw rugs and other things on the floor that can make you trip. What can I do with my stairs?  Do not leave any items on the stairs.  Make sure that there are handrails on both sides of the stairs and use them. Fix handrails that are broken or loose. Make sure that handrails are as long as the stairways.  Check any carpeting to make sure that it is firmly attached to the stairs. Fix any carpet that is loose or worn.  Avoid having throw rugs at the top or bottom of the stairs. If you do have throw rugs, attach them to the floor with carpet tape.  Make sure that you have a light switch at the top of the stairs and the bottom of the stairs. If you do not have them, ask someone to  add them for you. What else can I do to help prevent falls?  Wear shoes that:  Do not have high heels.  Have rubber bottoms.  Are comfortable and fit you well.  Are closed at the toe. Do not wear sandals.  If you use a stepladder:  Make sure that it is fully opened. Do not climb a closed stepladder.  Make sure that both sides of the stepladder are locked into place.  Ask someone to hold it for you, if possible.  Clearly mark and make sure that you can see:  Any grab bars or handrails.  First and last steps.  Where the edge of each step is.  Use tools that help you move around (mobility aids) if they are needed. These include:  Canes.  Walkers.  Scooters.  Crutches.  Turn on the lights when you go into a dark area. Replace any light bulbs as soon as they burn out.  Set up your furniture so you have a clear path. Avoid moving your furniture around.  If any of your floors are uneven, fix them.  If there are any pets around you, be aware of where they are.  Review your medicines with your doctor. Some medicines can make you feel dizzy. This can increase your chance of falling. Ask your doctor what other things that you can do to help prevent falls. This information is not intended to replace advice given to you by your health care provider. Make sure you discuss any questions you have with your health care provider. Document Released: 08/01/2009 Document Revised: 03/12/2016 Document Reviewed: 11/09/2014 Elsevier Interactive Patient Education  2017 [employer].

## 2020-10-08 NOTE — Progress Notes (Signed)
Subjective:   Ariyon Gerstenberger is a 74 y.o. female who presents for Medicare Annual (Subsequent) preventive examination.  Virtual Visit via Video Note  I connected with  [name] on 10/08/20 at 10:00 AM EST via telehealth video enabled device and verified that I am speaking with the correct person using two identifiers.  Location: Patient: home Provider: [place] Persons participating in the virtual visit: [place]   I discussed the limitations, risks, security and privacy concerns of performing an evaluation and management service by telephone and the availability of in person appointments. The patient expressed understanding and agreed to proceed.  Some vital signs may be absent or patient reported.   Clemetine Marker, LPN    Review of Systems     Cardiac Risk Factors include: advanced age (>73men, >43 women);hypertension;dyslipidemia     Objective:    There were no vitals filed for this visit. There is no height or weight on file to calculate BMI.  Advanced Directives 10/08/2020 03/09/2019  Does Patient Have a Medical Advance Directive? Yes No  Type of Paramedic of [place];Living will -  Copy of [place] in Chart? No - copy requested -  Would patient like information on creating a medical advance directive? - Yes (MAU/Ambulatory/Procedural Areas - Information given)    Current Medications (verified) Outpatient Encounter Medications as of 10/08/2020  Medication Sig  . amLODipine (NORVASC) 10 MG tablet Take 1 tablet (10 mg total) by mouth daily.  Marland Kitchen ELDERBERRY PO Take by mouth. gummies  . ezetimibe (ZETIA) 10 MG tablet Take 1 tablet (10 mg total) by mouth daily.  . hydrALAZINE (APRESOLINE) 25 MG tablet Take 1 tablet (25 mg total) by mouth 3 (three) times daily.  . Multiple Vitamin (MULITIVITAMIN WITH MINERALS) TABS Take 1 tablet by mouth daily.  . valsartan (DIOVAN) 160 MG tablet Take 1 tablet  (160 mg total) by mouth daily.   No facility-administered encounter medications on file as of 10/08/2020.    Allergies (verified) Patient has no known allergies.   History: Past Medical History:  Diagnosis Date  . Cataract   . Hypertension    Past Surgical History:  Procedure Laterality Date  . ABDOMINAL HYSTERECTOMY     History reviewed. No pertinent family history. Social History   Socioeconomic History  . Marital status: Married    Spouse name: robert  . Number of children: 2  . Years of education: Not on file  . Highest education level: Not on file  Occupational History  . Occupation: retired  Tobacco Use  . Smoking status: Former Smoker    Types: Cigarettes    Quit date: 02/28/1978    Years since quitting: 42.6  . Smokeless tobacco: Never Used  Vaping Use  . Vaping Use: Never used  Substance and Sexual Activity  . Alcohol use: No  . Drug use: No  . Sexual activity: Yes    Partners: Male  Other Topics Concern  . Not on file  Social History Narrative  . Not on file   Social Determinants of Health   Financial Resource Strain: Low Risk   . Difficulty of Paying Living Expenses: Not hard at all  Food Insecurity: No Food Insecurity  . Worried About Charity fundraiser in the Last Year: Never true  . Ran Out of Food in the Last Year: Never true  Transportation Needs: No Transportation Needs  . Lack of Transportation (Medical): No  . Lack of Transportation (Non-Medical):  No  Physical Activity: Inactive  . Days of Exercise per Week: 0 days  . Minutes of Exercise per Session: 0 min  Stress: No Stress Concern Present  . Feeling of Stress : Not at all  Social Connections: Moderately Integrated  . Frequency of Communication with Friends and Family: More than three times a week  . Frequency of Social Gatherings with Friends and Family: More than three times a week  . Attends Religious Services: More than 4 times per year  . Active Member of Clubs or  Organizations: No  . Attends Archivist Meetings: Never  . Marital Status: Married    Tobacco Counseling Counseling given: Not Answered   Clinical Intake:  Pre-visit preparation completed: Yes  Pain : No/denies pain     Nutritional Risks: None Diabetes: No  How often do you need to have someone help you when you read instructions, pamphlets, or other written materials from your doctor or pharmacy?: 1 - Never    Interpreter Needed?: No  Information entered by :: Clemetine Marker LPN   Activities of Daily Living In your present state of health, do you have any difficulty performing the following activities: 10/08/2020 10/02/2020  Hearing? N N  Comment declines hearing aids -  Vision? N N  Difficulty concentrating or making decisions? N N  Walking or climbing stairs? N N  Dressing or bathing? N N  Doing errands, shopping? N N  Preparing Food and eating ? N -  Using the Toilet? N -  In the past six months, have you accidently leaked urine? N -  Do you have problems with loss of bowel control? N -  Managing your Medications? N -  Managing your Finances? N -  Housekeeping or managing your Housekeeping? N -  Some recent data might be hidden    Patient Care Team: Delsa Grana, PA-C as PCP - General (Family Medicine)  Indicate any recent Medical Services you may have received from other than [facility] providers in the past year (date may be approximate).     Assessment:   This is a routine wellness examination for Vannesa.  Hearing/Vision screen  Hearing Screening   125Hz  250Hz  500Hz  1000Hz  2000Hz  3000Hz  4000Hz  6000Hz  8000Hz   Right ear:           Left ear:           Comments: Pt denies hearing difficulty  Vision Screening Comments: Vision screenings done by Dr. Ellin Mayhew and Dr. Herbert Deaner  Dietary issues and exercise activities discussed: Current Exercise Habits: The patient does not participate in regular exercise at present, Exercise limited by: None  identified  Goals    . Weight (lb) < 160 lb (72.6 kg)     Pt states she would like to lose weight over the next year to stay healthy and lower blood pressure.       Depression Screen PHQ 2/9 Scores 10/08/2020 10/02/2020 04/04/2020 05/15/2019 04/26/2019 03/17/2019 03/09/2019  PHQ - 2 Score 0 0 0 0 0 0 0  PHQ- 9 Score - - 0 0 0 0 0    Fall Risk Fall Risk  10/08/2020 10/02/2020 04/04/2020 05/15/2019 04/26/2019  Falls in the past year? 0 0 0 0 0  Number falls in past yr: 0 0 0 0 0  Injury with Fall? 0 0 0 0 0  Risk for fall due to : No Fall Risks - - - -  Follow up Falls prevention discussed Falls evaluation completed Falls evaluation completed Falls evaluation  completed Falls evaluation completed    FALL RISK PREVENTION PERTAINING TO THE HOME:  Any stairs in or around the home? Yes  If so, are there any without handrails? No  Home free of loose throw rugs in walkways, pet beds, electrical cords, etc? Yes  Adequate lighting in your home to reduce risk of falls? Yes   ASSISTIVE DEVICES UTILIZED TO PREVENT FALLS:  Life alert? No  Use of a cane, walker or w/c? No  Grab bars in the bathroom? Yes  Shower chair or bench in shower? Yes  Elevated toilet seat or a handicapped toilet? No   TIMED UP AND GO:  Was the test performed? No . Virtual visit.   Cognitive Function: Normal cognitive status assessed by direct observation by this Nurse Health Advisor. No abnormalities found.       6CIT Screen 03/09/2019  What Year? 0 points  What month? 0 points  What time? 0 points  Count back from 20 0 points  Months in reverse 0 points  Repeat phrase 0 points  Total Score 0    Immunizations Immunization History  Administered Date(s) Administered  . Influenza, High Dose Seasonal PF 08/08/2020  . PFIZER SARS-COV-2 Vaccination 11/15/2019, 12/06/2019, 08/08/2020  . Pneumococcal Conjugate-13 03/06/2019  . Pneumococcal Polysaccharide-23 10/02/2020  . Tdap 03/03/2019    TDAP status: Up to  date  Flu Vaccine status: Up to date  Pneumococcal vaccine status: Up to date  Covid-19 vaccine status: Completed vaccines  Qualifies for Shingles Vaccine? Yes   Zostavax completed No   Shingrix Completed?: No.    Education has been provided regarding the importance of this vaccine. Patient has been advised to call insurance company to determine out of pocket expense if they have not yet received this vaccine. Advised may also receive vaccine at local pharmacy or Health Dept. Verbalized acceptance and understanding.  Screening Tests Health Maintenance  Topic Date Due  . DEXA SCAN  Never done  . MAMMOGRAM  Never done  . COLONOSCOPY  10/04/2021 (Originally 04/26/1996)  . TETANUS/TDAP  03/02/2029  . INFLUENZA VACCINE  Completed  . COVID-19 Vaccine  Completed  . Hepatitis C Screening  Completed  . PNA vac Low Risk Adult  Completed    Health Maintenance  Health Maintenance Due  Topic Date Due  . DEXA SCAN  Never done  . MAMMOGRAM  Never done    Colorectal cancer screening: Referral to GI placed today. Pt aware the office will call re: appt.  Mammogram status: Ordered 10/02/20. Pt provided with contact info and advised to call to schedule appt.   Bone Density status: Ordered 10/02/20. Pt provided with contact info and advised to call to schedule appt.  Lung Cancer Screening: (Low Dose CT Chest recommended if Age 74-80 years, 30 pack-year currently smoking OR have quit w/in 15years.) does not qualify.   Additional Screening:  Hepatitis C Screening: does qualify; Completed 03/03/19  Vision Screening: Recommended annual ophthalmology exams for early detection of glaucoma and other disorders of the eye. Is the patient up to date with their annual eye exam?  Yes  Who is the provider or what is the name of the office in which the patient attends annual eye exams? Dr. Ellin Mayhew  Dental Screening: Recommended annual dental exams for proper oral hygiene  Community Resource Referral /  Chronic Care Management: CRR required this visit?  No   CCM required this visit?  No      Plan:     I have personally  reviewed and noted the following in the patient's chart:   . Medical and social history . Use of alcohol, tobacco or illicit drugs  . Current medications and supplements . Functional ability and status . Nutritional status . Physical activity . Advanced directives . List of other physicians . Hospitalizations, surgeries, and ER visits in previous 12 months . Vitals . Screenings to include cognitive, depression, and falls . Referrals and appointments  In addition, I have reviewed and discussed with patient certain preventive protocols, quality metrics, and best practice recommendations. A written personalized care plan for preventive services as well as general preventive health recommendations were provided to patient.     Clemetine Marker, LPN   [id number]   Nurse Notes: pt doing well and appreciative of visit today

## 2020-10-09 ENCOUNTER — Telehealth (INDEPENDENT_AMBULATORY_CARE_PROVIDER_SITE_OTHER): Payer: Self-pay | Admitting: Gastroenterology

## 2020-10-09 ENCOUNTER — Other Ambulatory Visit: Payer: Self-pay

## 2020-10-09 DIAGNOSIS — Z1211 Encounter for screening for malignant neoplasm of colon: Secondary | ICD-10-CM

## 2020-10-09 MED ORDER — NA SULFATE-K SULFATE-MG SULF 17.5-3.13-1.6 GM/177ML PO SOLN: 1.0000 | Freq: Once | ORAL | 0 refills | Status: AC

## 2020-10-09 NOTE — Progress Notes (Signed)
Gastroenterology Pre-Procedure Review  Request Date: Tuesday 11/05/20 Requesting Physician: Dr. Vicente Males  PATIENT REVIEW QUESTIONS: The patient responded to the following health history questions as indicated:    1. Are you having any GI issues? no 2. Do you have a personal history of Polyps? no 3. Do you have a family history of Colon Cancer or Polyps? no 4. Diabetes Mellitus? no 5. Joint replacements in the past 12 months?no Pt had Cataract surgery last May both eyes 6. Major health problems in the past 3 months?no 7. Any artificial heart valves, MVP, or defibrillator?no    MEDICATIONS & ALLERGIES:    Patient reports the following regarding taking any anticoagulation/antiplatelet therapy:   Plavix, Coumadin, Eliquis, Xarelto, Lovenox, Pradaxa, Brilinta, or Effient? no Aspirin? no  Patient confirms/reports the following medications:  Current Outpatient Medications  Medication Sig Dispense Refill  . amLODipine (NORVASC) 10 MG tablet Take 1 tablet (10 mg total) by mouth daily. 90 tablet 3  . ELDERBERRY PO Take by mouth. gummies    . ezetimibe (ZETIA) 10 MG tablet Take 1 tablet (10 mg total) by mouth daily. 90 tablet 3  . hydrALAZINE (APRESOLINE) 25 MG tablet Take 1 tablet (25 mg total) by mouth 3 (three) times daily. 180 tablet 3  . Multiple Vitamin (MULITIVITAMIN WITH MINERALS) TABS Take 1 tablet by mouth daily.    . valsartan (DIOVAN) 160 MG tablet Take 1 tablet (160 mg total) by mouth daily. 90 tablet 3   No current facility-administered medications for this visit.    Patient confirms/reports the following allergies:  No Known Allergies  No orders of the defined types were placed in this encounter.   AUTHORIZATION INFORMATION Primary Insurance: 1D#: Group #:  Secondary Insurance: 1D#: Group #:  SCHEDULE INFORMATION: Date: 11/05/20 Time: Location:[facility]

## 2020-10-14 NOTE — Chronic Care Management (AMB) (Signed)
  Chronic Care Management   Note  10/14/2020 Name: Jeanne Hickman MRN: [id number] DOB: March 22, 1946  Jeanne Hickman is a 74 y.o. year old female who is a primary care patient of Delsa Grana, [place]. I reached out to Jeanne Hickman by phone today in response to a referral sent by Ms. Jeanne Grip Olander's health plan.     [name] was given information about Chronic Care Management services today including:  1. CCM service includes personalized support from designated clinical staff supervised by her physician, including individualized plan of care and coordination with other care providers 2. 24/7 contact phone numbers for assistance for urgent and routine care needs. 3. Service will only be billed when office clinical staff spend 20 minutes or more in a month to coordinate care. 4. Only one practitioner may furnish and bill the service in a calendar month. 5. The patient may stop CCM services at any time (effective at the end of the month) by phone call to the office staff. 6. The patient will be responsible for cost sharing (co-pay) of up to 20% of the service fee (after annual deductible is met).  Patient did not agree to enrollment in care management services and does not wish to consider at this time.  Follow up plan: Patient declines engagement by the care management team. Appropriate care team members and provider have been notified via electronic communication. The care management team is available to the patient after provider conversation with the patient regarding recommendation for care management engagement and subsequent re-referral to the care management team.   [place] [facility]

## 2020-10-21 ENCOUNTER — Telehealth: Payer: Self-pay | Admitting: Gastroenterology

## 2020-10-21 NOTE — Telephone Encounter (Signed)
Patient states she can not afford procedure right now and wants to cancel appt with Dr. Tobi Bastos on 1.18.22. Pt states she will cb to resch at later date. Pt had screening call on 12.22.21.

## 2020-10-22 NOTE — Telephone Encounter (Signed)
Call returned to patient in regards to canceling her colonoscopy scheduled for 11/05/20.  She states she will call back in March to reschedule to allow her time to save up some money for the procedure.  Colonoscopy has been canceled with Trish in Endo.  Referral noted.  Thanks,  [place], [place]

## 2020-11-05 ENCOUNTER — Ambulatory Visit: Admit: 2020-11-05 | Payer: Medicare Other | Admitting: Gastroenterology

## 2020-11-05 SURGERY — COLONOSCOPY WITH PROPOFOL
Anesthesia: General

## 2021-01-10 ENCOUNTER — Other Ambulatory Visit: Payer: Self-pay | Admitting: Family Medicine

## 2021-01-10 DIAGNOSIS — I10 Essential (primary) hypertension: Secondary | ICD-10-CM

## 2021-01-30 ENCOUNTER — Ambulatory Visit: Payer: Medicare Other | Admitting: Family Medicine

## 2021-03-27 ENCOUNTER — Ambulatory Visit: Payer: Medicare Other | Admitting: Family Medicine

## 2021-03-28 ENCOUNTER — Ambulatory Visit: Payer: Medicare Other | Admitting: Family Medicine

## 2021-04-02 ENCOUNTER — Ambulatory Visit: Payer: Medicare Other | Admitting: Dermatology

## 2021-04-03 ENCOUNTER — Encounter: Payer: Self-pay | Admitting: Unknown Physician Specialty

## 2021-04-03 ENCOUNTER — Ambulatory Visit (INDEPENDENT_AMBULATORY_CARE_PROVIDER_SITE_OTHER): Payer: Medicare Other | Admitting: Unknown Physician Specialty

## 2021-04-03 ENCOUNTER — Other Ambulatory Visit: Payer: Self-pay

## 2021-04-03 VITALS — BP 136/88 | HR 81 | Temp 98.1°F | Resp 16 | Ht 66.0 in | Wt 166.0 lb

## 2021-04-03 DIAGNOSIS — E119 Type 2 diabetes mellitus without complications: Secondary | ICD-10-CM

## 2021-04-03 DIAGNOSIS — I10 Essential (primary) hypertension: Secondary | ICD-10-CM

## 2021-04-03 DIAGNOSIS — E782 Mixed hyperlipidemia: Secondary | ICD-10-CM | POA: Diagnosis not present

## 2021-04-03 LAB — POCT GLYCOSYLATED HEMOGLOBIN (HGB A1C): Hemoglobin A1C: 6.6 % — AB (ref 4.0–5.6)

## 2021-04-03 MED ORDER — VALSARTAN 160 MG PO TABS: 160.0000 mg | ORAL_TABLET | Freq: Every day | ORAL | 3 refills | Status: DC

## 2021-04-03 MED ORDER — AMLODIPINE BESYLATE 10 MG PO TABS
10.0000 mg | ORAL_TABLET | Freq: Every day | ORAL | 3 refills | Status: DC
Start: 2021-04-03 — End: 2022-05-18

## 2021-04-03 MED ORDER — EZETIMIBE 10 MG PO TABS
10.0000 mg | ORAL_TABLET | Freq: Every day | ORAL | 3 refills | Status: DC
Start: 2021-04-03 — End: 2022-04-24

## 2021-04-03 MED ORDER — HYDRALAZINE HCL 25 MG PO TABS: 25.0000 mg | ORAL_TABLET | Freq: Three times a day (TID) | ORAL | 0 refills | Status: DC

## 2021-04-03 NOTE — Progress Notes (Signed)
BP 136/88   Pulse 81   Temp 98.1 F (36.7 C) (Oral)   Resp 16   Ht 5\' 6"  (1.676 m)   Wt 166 lb (75.3 kg)   SpO2 98%   BMI 26.79 kg/m    Subjective:    Patient ID: Jeanne Hickman, female    DOB: 18-Jan-1946, 75 y.o.   MRN: [id number]  HPI: Jeanne Hickman is a 75 y.o. female  Chief Complaint  Patient presents with   Follow-up   Diabetes: Using medications without difficulties No hypoglycemic episodes No hyperglycemic episodes Feet problems: none Blood Sugars averaging: eye exam within last year Last Hgb A1C: 6.6  Hypertension  Using medications without difficulty Average home BPs   Using medication without problems or lightheadedness No chest pain with exertion or shortness of breath No Edema  Elevated Cholesterol Both statin and Zetia intolerant.   No Muscle aches  Diet: Exercise: Working on yard and eats well.    The 10-year ASCVD risk score Mikey Bussing DC Brooke Bonito., et al., 2013) is: 36.4%   Values used to calculate the score:     Age: 26 years     Sex: Female     Is Non-Hispanic African American: Yes     Diabetic: Yes     Tobacco smoker: No     Systolic Blood Pressure: 322 mmHg     Is BP treated: Yes     HDL Cholesterol: 53 mg/dL     Total Cholesterol: 221 mg/dL    Relevant past medical, surgical, family and social history reviewed and updated as indicated. Interim medical history since our last visit reviewed. Allergies and medications reviewed and updated.  Review of Systems  Constitutional: Negative.   HENT: Negative.    Respiratory: Negative.    Cardiovascular: Negative.   Gastrointestinal: Negative.   Musculoskeletal: Negative.   Skin:        Seborrheic keratosis on right cheek   Per HPI unless specifically indicated above     Objective:    BP 136/88   Pulse 81   Temp 98.1 F (36.7 C) (Oral)   Resp 16   Ht 5\' 6"  (1.676 m)   Wt 166 lb (75.3 kg)   SpO2 98%   BMI 26.79 kg/m   Wt Readings from Last 3 Encounters:  04/03/21  166 lb (75.3 kg)  10/02/20 170 lb 4.8 oz (77.2 kg)  04/04/20 172 lb 8 oz (78.2 kg)    Physical Exam Constitutional:      General: She is not in acute distress.    Appearance: Normal appearance. She is well-developed.  HENT:     Head: Normocephalic and atraumatic.  Eyes:     General: Lids are normal. No scleral icterus.       Right eye: No discharge.        Left eye: No discharge.     Conjunctiva/sclera: Conjunctivae normal.  Neck:     Vascular: No carotid bruit or JVD.  Cardiovascular:     Rate and Rhythm: Normal rate and regular rhythm.     Heart sounds: Normal heart sounds.  Pulmonary:     Effort: Pulmonary effort is normal.     Breath sounds: Normal breath sounds.  Abdominal:     Palpations: There is no hepatomegaly or splenomegaly.  Musculoskeletal:        General: Normal range of motion.     Cervical back: Normal range of motion and neck supple.  Skin:    General: Skin is  warm and dry.     Coloration: Skin is not pale.     Findings: No rash.  Neurological:     Mental Status: She is alert and oriented to person, place, and time.  Psychiatric:        Behavior: Behavior normal.        Thought Content: Thought content normal.        Judgment: Judgment normal.   Diabetic Foot Exam - Simple   No data filed         Assessment & Plan:   Problem List Items Addressed This Visit       Unprioritized   Essential hypertension    Stable, continue present medications.         Relevant Medications   valsartan (DIOVAN) 160 MG tablet   amLODipine (NORVASC) 10 MG tablet   hydrALAZINE (APRESOLINE) 25 MG tablet   ezetimibe (ZETIA) 10 MG tablet   Mixed hyperlipidemia    Statin and Zetia intolerant. Discussed going to the lipid clinic.  She is willing to rechallenge herself with the Zetia.         Relevant Medications   valsartan (DIOVAN) 160 MG tablet   amLODipine (NORVASC) 10 MG tablet   hydrALAZINE (APRESOLINE) 25 MG tablet   ezetimibe (ZETIA) 10 MG tablet    New onset type 2 diabetes mellitus ([place]) - Primary    Stable at 6.6.  Continue present medications.        Relevant Medications   valsartan (DIOVAN) 160 MG tablet   Other Relevant Orders   POCT HgB A1C (Completed)     Follow up plan: Return in about 6 months (around 10/03/2021).

## 2021-04-03 NOTE — Assessment & Plan Note (Signed)
Stable at 6.6.  Continue present medications.

## 2021-04-03 NOTE — Assessment & Plan Note (Addendum)
Statin and Zetia intolerant. Discussed going to the lipid clinic.  She is willing to rechallenge herself with the Zetia.

## 2021-04-03 NOTE — Assessment & Plan Note (Signed)
Stable, continue present medications.   

## 2021-06-20 ENCOUNTER — Other Ambulatory Visit: Payer: Self-pay | Admitting: Family Medicine

## 2021-06-20 DIAGNOSIS — I10 Essential (primary) hypertension: Secondary | ICD-10-CM

## 2021-07-22 ENCOUNTER — Other Ambulatory Visit: Payer: Self-pay | Admitting: Unknown Physician Specialty

## 2021-07-22 DIAGNOSIS — I10 Essential (primary) hypertension: Secondary | ICD-10-CM

## 2021-07-23 NOTE — Telephone Encounter (Signed)
Requested medication (s) are due for refill today Yes  Last ordered 04/03/21  Requested medication (s) are on the active medication list Yes  Future visit scheduled 10/03/21  Note to clinic-Last prescribed by other provider. Routing to pcp for review.   Requested Prescriptions  Pending Prescriptions Disp Refills   hydrALAZINE (APRESOLINE) 25 MG tablet [Pharmacy Med Name: hydrALAZINE HCl 25 MG Oral Tablet] 270 tablet 0    Sig: TAKE 1 TABLET BY MOUTH THREE TIMES DAILY     Cardiovascular:  Vasodilators Passed - 07/22/2021  6:00 PM      Passed - HCT in normal range and within 360 days    HCT  Date Value Ref Range Status  10/02/2020 38.7 35.0 - 45.0 % Final          Passed - HGB in normal range and within 360 days    Hemoglobin  Date Value Ref Range Status  10/02/2020 12.5 11.7 - 15.5 g/dL Final          Passed - RBC in normal range and within 360 days    RBC  Date Value Ref Range Status  10/02/2020 4.66 3.80 - 5.10 Million/uL Final          Passed - WBC in normal range and within 360 days    WBC  Date Value Ref Range Status  10/02/2020 5.9 3.8 - 10.8 Thousand/uL Final          Passed - PLT in normal range and within 360 days    Platelets  Date Value Ref Range Status  10/02/2020 296 140 - 400 Thousand/uL Final          Passed - Last BP in normal range    BP Readings from Last 1 Encounters:  04/03/21 136/88          Passed - Valid encounter within last 12 months    Recent Outpatient Visits           3 months ago New onset type 2 diabetes mellitus [facility])   [facility] Kathrine Haddock, NP   9 months ago Essential hypertension   [facility] Delsa Grana, PA-C   1 year ago Essential hypertension   [facility] Delsa Grana, PA-C   2 years ago Essential hypertension   [place], Astrid Divine, [place]   2 years ago Essential hypertension   [place], Astrid Divine,  [place]       Future Appointments             In 2 months Delsa Grana, PA-C [facility], [place]   In 2 months  [facility], [facility]

## 2021-07-24 ENCOUNTER — Other Ambulatory Visit: Payer: Self-pay | Admitting: Unknown Physician Specialty

## 2021-07-24 DIAGNOSIS — I10 Essential (primary) hypertension: Secondary | ICD-10-CM

## 2021-07-24 NOTE — Telephone Encounter (Signed)
Requested medication (s) are due for refill today:   Yes but last refilled by Kathrine Haddock  Requested medication (s) are on the active medication list:   Yes  Future visit scheduled:   Yes in 2 mo.   Last ordered: 04/03/2021 #270, 0 refills   Last refilled on 04/11/2021     Returned because last provider no longer there Kathrine Haddock   Requested Prescriptions  Pending Prescriptions Disp Refills   hydrALAZINE (APRESOLINE) 25 MG tablet [Pharmacy Med Name: hydrALAZINE HCl 25 MG Oral Tablet] 270 tablet 0    Sig: TAKE 1 TABLET BY MOUTH THREE TIMES DAILY     Cardiovascular:  Vasodilators Passed - 07/24/2021 10:33 AM      Passed - HCT in normal range and within 360 days    HCT  Date Value Ref Range Status  10/02/2020 38.7 35.0 - 45.0 % Final          Passed - HGB in normal range and within 360 days    Hemoglobin  Date Value Ref Range Status  10/02/2020 12.5 11.7 - 15.5 g/dL Final          Passed - RBC in normal range and within 360 days    RBC  Date Value Ref Range Status  10/02/2020 4.66 3.80 - 5.10 Million/uL Final          Passed - WBC in normal range and within 360 days    WBC  Date Value Ref Range Status  10/02/2020 5.9 3.8 - 10.8 Thousand/uL Final          Passed - PLT in normal range and within 360 days    Platelets  Date Value Ref Range Status  10/02/2020 296 140 - 400 Thousand/uL Final          Passed - Last BP in normal range    BP Readings from Last 1 Encounters:  04/03/21 136/88          Passed - Valid encounter within last 12 months    Recent Outpatient Visits           3 months ago New onset type 2 diabetes mellitus [facility])   [facility] Kathrine Haddock, NP   9 months ago Essential hypertension   [facility] Delsa Grana, PA-C   1 year ago Essential hypertension   [facility] Delsa Grana, PA-C   2 years ago Essential hypertension   [place], Astrid Divine, [place]    2 years ago Essential hypertension   [place], Astrid Divine, [place]       Future Appointments             In 2 months Delsa Grana, PA-C [facility], [place]   In 2 months  [facility], [facility]

## 2021-07-24 NOTE — Telephone Encounter (Signed)
I called the [facility] [phone] because we received a duplicate request.   It was last filled on 04/11/2021.   No refills left on this Rx.    I forwarded the request to [facility] because Kathrine Haddock last provider to prescribe this.

## 2021-10-03 ENCOUNTER — Ambulatory Visit: Payer: Medicare Other | Admitting: Internal Medicine

## 2021-10-14 ENCOUNTER — Ambulatory Visit (INDEPENDENT_AMBULATORY_CARE_PROVIDER_SITE_OTHER): Payer: Medicare Other

## 2021-10-14 DIAGNOSIS — Z1231 Encounter for screening mammogram for malignant neoplasm of breast: Secondary | ICD-10-CM

## 2021-10-14 DIAGNOSIS — Z780 Asymptomatic menopausal state: Secondary | ICD-10-CM | POA: Diagnosis not present

## 2021-10-14 DIAGNOSIS — Z0000 Encounter for general adult medical examination without abnormal findings: Secondary | ICD-10-CM | POA: Diagnosis not present

## 2021-10-14 DIAGNOSIS — Z1211 Encounter for screening for malignant neoplasm of colon: Secondary | ICD-10-CM

## 2021-10-14 NOTE — Patient Instructions (Signed)
Ms. Jeanne Hickman , Thank you for taking time to come for your Medicare Wellness Visit. I appreciate your ongoing commitment to your health goals. Please review the following plan we discussed and let me know if I can assist you in the future.   Screening recommendations/referrals: Colonoscopy: A referral was sent to [facility] today for screening colonoscopy. They will contact you for an appointment.  Mammogram: Due. Please call [phone] to schedule your mammogram and bone density screening.  Bone Density: due Recommended yearly ophthalmology/optometry visit for glaucoma screening and checkup Recommended yearly dental visit for hygiene and checkup  Vaccinations: Influenza vaccine: due Pneumococcal vaccine: done 10/02/20 Tdap vaccine: done 03/03/19 Shingles vaccine: Shingrix discussed. Please contact your pharmacy for coverage information.  Covid-19: done 11/15/19, 12/06/19 & 08/08/20  Advanced directives: Please bring a copy of your health care power of attorney and living will to the office at your convenience.   Conditions/risks identified: Keep up the great work!  Next appointment: Follow up in one year for your annual wellness visit    Preventive Care 65 Years and Older, Female Preventive care refers to lifestyle choices and visits with your health care provider that can promote health and wellness. What does preventive care include? A yearly physical exam. This is also called an annual well check. Dental exams once or twice a year. Routine eye exams. Ask your health care provider how often you should have your eyes checked. Personal lifestyle choices, including: Daily care of your teeth and gums. Regular physical activity. Eating a healthy diet. Avoiding tobacco and drug use. Limiting alcohol use. Practicing safe sex. Taking low-dose aspirin every day. Taking vitamin and mineral supplements as recommended by your health care provider. What happens during an annual  well check? The services and screenings done by your health care provider during your annual well check will depend on your age, overall health, lifestyle risk factors, and family history of disease. Counseling  Your health care provider may ask you questions about your: Alcohol use. Tobacco use. Drug use. Emotional well-being. Home and relationship well-being. Sexual activity. Eating habits. History of falls. Memory and ability to understand (cognition). Work and work Statistician. Reproductive health. Screening  You may have the following tests or measurements: Height, weight, and BMI. Blood pressure. Lipid and cholesterol levels. These may be checked every 5 years, or more frequently if you are over 72 years old. Skin check. Lung cancer screening. You may have this screening every year starting at age 63 if you have a 30-pack-year history of smoking and currently smoke or have quit within the past 15 years. Fecal occult blood test (FOBT) of the stool. You may have this test every year starting at age 41. Flexible sigmoidoscopy or colonoscopy. You may have a sigmoidoscopy every 5 years or a colonoscopy every 10 years starting at age 11. Hepatitis C blood test. Hepatitis B blood test. Sexually transmitted disease (STD) testing. Diabetes screening. This is done by checking your blood sugar (glucose) after you have not eaten for a while (fasting). You may have this done every 1-3 years. Bone density scan. This is done to screen for osteoporosis. You may have this done starting at age 63. Mammogram. This may be done every 1-2 years. Talk to your health care provider about how often you should have regular mammograms. Talk with your health care provider about your test results, treatment options, and if necessary, the need for more tests. Vaccines  Your health care provider may recommend certain vaccines, such as:  Influenza vaccine. This is recommended every year. Tetanus, diphtheria,  and acellular pertussis (Tdap, Td) vaccine. You may need a Td booster every 10 years. Zoster vaccine. You may need this after age 14. Pneumococcal 13-valent conjugate (PCV13) vaccine. One dose is recommended after age 93. Pneumococcal polysaccharide (PPSV23) vaccine. One dose is recommended after age 77. Talk to your health care provider about which screenings and vaccines you need and how often you need them. This information is not intended to replace advice given to you by your health care provider. Make sure you discuss any questions you have with your health care provider. Document Released: 11/01/2015 Document Revised: 06/24/2016 Document Reviewed: 08/06/2015 Elsevier Interactive Patient Education  2017 [place] Prevention in the Home Falls can cause injuries. They can happen to people of all ages. There are many things you can do to make your home safe and to help prevent falls. What can I do on the outside of my home? Regularly fix the edges of walkways and driveways and fix any cracks. Remove anything that might make you trip as you walk through a door, such as a raised step or threshold. Trim any bushes or trees on the path to your home. Use bright outdoor lighting. Clear any walking paths of anything that might make someone trip, such as rocks or tools. Regularly check to see if handrails are loose or broken. Make sure that both sides of any steps have handrails. Any raised decks and porches should have guardrails on the edges. Have any leaves, snow, or ice cleared regularly. Use sand or salt on walking paths during winter. Clean up any spills in your garage right away. This includes oil or grease spills. What can I do in the bathroom? Use night lights. Install grab bars by the toilet and in the tub and shower. Do not use towel bars as grab bars. Use non-skid mats or decals in the tub or shower. If you need to sit down in the shower, use a plastic, non-slip  stool. Keep the floor dry. Clean up any water that spills on the floor as soon as it happens. Remove soap buildup in the tub or shower regularly. Attach bath mats securely with double-sided non-slip rug tape. Do not have throw rugs and other things on the floor that can make you trip. What can I do in the bedroom? Use night lights. Make sure that you have a light by your bed that is easy to reach. Do not use any sheets or blankets that are too big for your bed. They should not hang down onto the floor. Have a firm chair that has side arms. You can use this for support while you get dressed. Do not have throw rugs and other things on the floor that can make you trip. What can I do in the kitchen? Clean up any spills right away. Avoid walking on wet floors. Keep items that you use a lot in easy-to-reach places. If you need to reach something above you, use a strong step stool that has a grab bar. Keep electrical cords out of the way. Do not use floor polish or wax that makes floors slippery. If you must use wax, use non-skid floor wax. Do not have throw rugs and other things on the floor that can make you trip. What can I do with my stairs? Do not leave any items on the stairs. Make sure that there are handrails on both sides of the stairs and use them.  Fix handrails that are broken or loose. Make sure that handrails are as long as the stairways. Check any carpeting to make sure that it is firmly attached to the stairs. Fix any carpet that is loose or worn. Avoid having throw rugs at the top or bottom of the stairs. If you do have throw rugs, attach them to the floor with carpet tape. Make sure that you have a light switch at the top of the stairs and the bottom of the stairs. If you do not have them, ask someone to add them for you. What else can I do to help prevent falls? Wear shoes that: Do not have high heels. Have rubber bottoms. Are comfortable and fit you well. Are closed at the  toe. Do not wear sandals. If you use a stepladder: Make sure that it is fully opened. Do not climb a closed stepladder. Make sure that both sides of the stepladder are locked into place. Ask someone to hold it for you, if possible. Clearly mark and make sure that you can see: Any grab bars or handrails. First and last steps. Where the edge of each step is. Use tools that help you move around (mobility aids) if they are needed. These include: Canes. Walkers. Scooters. Crutches. Turn on the lights when you go into a dark area. Replace any light bulbs as soon as they burn out. Set up your furniture so you have a clear path. Avoid moving your furniture around. If any of your floors are uneven, fix them. If there are any pets around you, be aware of where they are. Review your medicines with your doctor. Some medicines can make you feel dizzy. This can increase your chance of falling. Ask your doctor what other things that you can do to help prevent falls. This information is not intended to replace advice given to you by your health care provider. Make sure you discuss any questions you have with your health care provider. Document Released: 08/01/2009 Document Revised: 03/12/2016 Document Reviewed: 11/09/2014 Elsevier Interactive Patient Education  2017 [employer].

## 2021-10-14 NOTE — Progress Notes (Signed)
Subjective:   Jeanne Hickman is a 75 y.o. female who presents for Medicare Annual (Subsequent) preventive examination.  Virtual Visit via Telephone Note  I connected with  Jeanne Hickman on 10/14/21 at 10:00 AM EST by telephone and verified that I am speaking with the correct person using two identifiers.  Location: Patient: home Provider: [place] Persons participating in the virtual visit: [place]   I discussed the limitations, risks, security and privacy concerns of performing an evaluation and management service by telephone and the availability of in person appointments. The patient expressed understanding and agreed to proceed.  Interactive audio and video telecommunications were attempted between this nurse and patient, however failed, due to patient having technical difficulties OR patient did not have access to video capability.  We continued and completed visit with audio only.  Some vital signs may be absent or patient reported.   Jeanne Marker, LPN   Review of Systems     Cardiac Risk Factors include: advanced age (>76men, >21 women);diabetes mellitus;dyslipidemia;hypertension     Objective:    There were no vitals filed for this visit. There is no height or weight on file to calculate BMI.  Advanced Directives 10/14/2021 10/08/2020 03/09/2019  Does Patient Have a Medical Advance Directive? Yes Yes No  Type of Paramedic of [place];Living will [place];Living will -  Copy of [place] in Chart? No - copy requested No - copy requested -  Would patient like information on creating a medical advance directive? - - Yes (MAU/Ambulatory/Procedural Areas - Information given)    Current Medications (verified) Outpatient Encounter Medications as of 10/14/2021  Medication Sig   amLODipine (NORVASC) 10 MG tablet Take 1 tablet (10 mg total) by mouth daily.   ELDERBERRY PO Take  by mouth. gummies   ezetimibe (ZETIA) 10 MG tablet Take 1 tablet (10 mg total) by mouth daily.   hydrALAZINE (APRESOLINE) 25 MG tablet TAKE 1 TABLET BY MOUTH THREE TIMES DAILY   Multiple Vitamin (MULITIVITAMIN WITH MINERALS) TABS Take 1 tablet by mouth daily.   valsartan (DIOVAN) 160 MG tablet Take 1 tablet by mouth once daily   No facility-administered encounter medications on file as of 10/14/2021.    Allergies (verified) Patient has no known allergies.   History: Past Medical History:  Diagnosis Date   Cataract    Hypertension    Past Surgical History:  Procedure Laterality Date   ABDOMINAL HYSTERECTOMY     History reviewed. No pertinent family history. Social History   Socioeconomic History   Marital status: Married    Spouse name: robert   Number of children: 2   Years of education: Not on file   Highest education level: Not on file  Occupational History   Occupation: retired  Tobacco Use   Smoking status: Former    Types: Cigarettes    Quit date: 02/28/1978    Years since quitting: 43.6   Smokeless tobacco: Never  Vaping Use   Vaping Use: Never used  Substance and Sexual Activity   Alcohol use: No   Drug use: No   Sexual activity: Yes    Partners: Male  Other Topics Concern   Not on file  Social History Narrative   Not on file   Social Determinants of Health   Financial Resource Strain: Low Risk    Difficulty of Paying Living Expenses: Not hard at all  Food Insecurity: No Food Insecurity   Worried About Running  Out of Food in the Last Year: Never true   Ran Out of Food in the Last Year: Never true  Transportation Needs: No Transportation Needs   Lack of Transportation (Medical): No   Lack of Transportation (Non-Medical): No  Physical Activity: Insufficiently Active   Days of Exercise per Week: 3 days   Minutes of Exercise per Session: 30 min  Stress: No Stress Concern Present   Feeling of Stress : Only a little  Social Connections: Moderately  Integrated   Frequency of Communication with Friends and Family: More than three times a week   Frequency of Social Gatherings with Friends and Family: More than three times a week   Attends Religious Services: More than 4 times per year   Active Member of [employer] or Organizations: No   Attends Music therapist: Never   Marital Status: Married    Tobacco Counseling Counseling given: Not Answered   Clinical Intake:  Pre-visit preparation completed: Yes  Pain : No/denies pain     Nutritional Risks: None Diabetes: Yes CBG done?: No Did pt. bring in CBG monitor from home?: No  How often do you need to have someone help you when you read instructions, pamphlets, or other written materials from your doctor or pharmacy?: 1 - Never  Nutrition Risk Assessment:  Has the patient had any N/V/D within the last 2 months?  No  Does the patient have any non-healing wounds?  No  Has the patient had any unintentional weight loss or weight gain?  No   Diabetes:  Is the patient diabetic?  Yes  If diabetic, was a CBG obtained today?  No  Did the patient bring in their glucometer from home?  No  How often do you monitor your CBG's? daily.   Financial Strains and Diabetes Management:  Are you having any financial strains with the device, your supplies or your medication? No .  Does the patient want to be seen by Chronic Care Management for management of their diabetes?  No  Would the patient like to be referred to a Nutritionist or for Diabetic Management?  No   Diabetic Exams:  Diabetic Eye Exam: Completed per patient with Dr. Ellin Mayhew; need records.   Diabetic Foot Exam: Pt has been advised about the importance in completing this exam. Pt is scheduled for diabetic foot exam on 10/24/21.    Interpreter Needed?: No  Information entered by :: Jeanne Marker LPN   Activities of Daily Living In your present state of health, do you have any difficulty performing the following  activities: 10/14/2021 04/03/2021  Hearing? N N  Vision? N N  Difficulty concentrating or making decisions? N N  Walking or climbing stairs? N N  Dressing or bathing? N N  Doing errands, shopping? N N  Preparing Food and eating ? N -  Using the Toilet? N -  In the past six months, have you accidently leaked urine? N -  Do you have problems with loss of bowel control? N -  Managing your Medications? N -  Managing your Finances? N -  Housekeeping or managing your Housekeeping? N -  Some recent data might be hidden    Patient Care Team: Delsa Grana, PA-C as PCP - General (Family Medicine)  Indicate any recent Medical Services you may have received from other than [facility] providers in the past year (date may be approximate).     Assessment:   This is a routine wellness examination for Merisa.  Hearing/Vision screen  Hearing Screening - Comments:: Pt denies hearing difficulty Vision Screening - Comments:: Vision screenings done by Dr. Ellin Mayhew   Dietary issues and exercise activities discussed: Current Exercise Habits: Home exercise routine, Type of exercise: walking, Time (Minutes): 30, Frequency (Times/Week): 3, Weekly Exercise (Minutes/Week): 90, Intensity: Moderate, Exercise limited by: None identified   Goals Addressed   None    Depression Screen PHQ 2/9 Scores 10/14/2021 04/03/2021 10/08/2020 10/02/2020 04/04/2020 05/15/2019 04/26/2019  PHQ - 2 Score 0 0 0 0 0 0 0  PHQ- 9 Score - - - - 0 0 0    Fall Risk Fall Risk  10/14/2021 04/03/2021 10/08/2020 10/02/2020 04/04/2020  Falls in the past year? 0 0 0 0 0  Number falls in past yr: 0 0 0 0 0  Injury with Fall? 0 0 0 0 0  Risk for fall due to : No Fall Risks - No Fall Risks - -  Follow up Falls prevention discussed - Falls prevention discussed Falls evaluation completed Falls evaluation completed    [place]:  Any stairs in or around the home? Yes  If so, are there any without handrails? No   Home free of loose throw rugs in walkways, pet beds, electrical cords, etc? Yes  Adequate lighting in your home to reduce risk of falls? Yes   ASSISTIVE DEVICES UTILIZED TO PREVENT FALLS:  Life alert? No  Use of a cane, walker or w/c? No  Grab bars in the bathroom? Yes  Shower chair or bench in shower? No Elevated toilet seat or a handicapped toilet? No   TIMED UP AND GO:  Was the test performed? No . Telephonic visit  Cognitive Function: Normal cognitive status assessed by direct observation by this Nurse Health Advisor. No abnormalities found.       6CIT Screen 03/09/2019  What Year? 0 points  What month? 0 points  What time? 0 points  Count back from 20 0 points  Months in reverse 0 points  Repeat phrase 0 points  Total Score 0    Immunizations Immunization History  Administered Date(s) Administered   Influenza, High Dose Seasonal PF 08/08/2020   PFIZER(Purple Top)SARS-COV-2 Vaccination 11/15/2019, 12/06/2019, 08/08/2020   Pneumococcal Conjugate-13 03/06/2019   Pneumococcal Polysaccharide-23 10/02/2020   Tdap 03/03/2019    TDAP status: Up to date  Flu Vaccine status: Due, Education has been provided regarding the importance of this vaccine. Advised may receive this vaccine at local pharmacy or Health Dept. Aware to provide a copy of the vaccination record if obtained from local pharmacy or Health Dept. Verbalized acceptance and understanding.  Pneumococcal vaccine status: Up to date  Covid-19 vaccine status: Completed vaccines  Qualifies for Shingles Vaccine? Yes   Zostavax completed No   Shingrix Completed?: No.    Education has been provided regarding the importance of this vaccine. Patient has been advised to call insurance company to determine out of pocket expense if they have not yet received this vaccine. Advised may also receive vaccine at local pharmacy or Health Dept. Verbalized acceptance and understanding.  Screening Tests Health Maintenance  Topic  Date Due   DEXA SCAN  Never done   FOOT EXAM  Never done   MAMMOGRAM  Never done   COLONOSCOPY (Pts 45-75yrs Insurance coverage will need to be confirmed)  Never done   Zoster Vaccines- Shingrix (1 of 2) Never done   OPHTHALMOLOGY EXAM  07/25/2020   COVID-19 Vaccine (4 - Booster for [place] series) 10/03/2020  INFLUENZA VACCINE  05/19/2021   HEMOGLOBIN A1C  10/03/2021   TETANUS/TDAP  03/02/2029   Pneumonia Vaccine 61+ Years old  Completed   Hepatitis C Screening  Completed   HPV VACCINES  Aged Out    Health Maintenance  Health Maintenance Due  Topic Date Due   DEXA SCAN  Never done   FOOT EXAM  Never done   MAMMOGRAM  Never done   COLONOSCOPY (Pts 45-79yrs Insurance coverage will need to be confirmed)  Never done   Zoster Vaccines- Shingrix (1 of 2) Never done   OPHTHALMOLOGY EXAM  07/25/2020   COVID-19 Vaccine (4 - Booster for Pfizer series) 10/03/2020   INFLUENZA VACCINE  05/19/2021   HEMOGLOBIN A1C  10/03/2021    Colorectal cancer screening: Referral to GI placed today. Pt aware the office will call re: appt.  Mammogram status: Ordered today. Pt provided with contact info and advised to call to schedule appt.   Bone Density status: Ordered today. Pt provided with contact info and advised to call to schedule appt.  Lung Cancer Screening: (Low Dose CT Chest recommended if Age 71-80 years, 30 pack-year currently smoking OR have quit w/in 15years.) does not qualify.   Additional Screening:  Hepatitis C Screening: does qualify; Completed 03/03/19.   Vision Screening: Recommended annual ophthalmology exams for early detection of glaucoma and other disorders of the eye. Is the patient up to date with their annual eye exam?  Yes  Who is the provider or what is the name of the office in which the patient attends annual eye exams? Dr. Ellin Mayhew.   Dental Screening: Recommended annual dental exams for proper oral hygiene  Community Resource Referral / Chronic Care  Management: CRR required this visit?  No   CCM required this visit?  No      Plan:     I have personally reviewed and noted the following in the patients chart:   Medical and social history Use of alcohol, tobacco or illicit drugs  Current medications and supplements including opioid prescriptions.  Functional ability and status Nutritional status Physical activity Advanced directives List of other physicians Hospitalizations, surgeries, and ER visits in previous 12 months Vitals Screenings to include cognitive, depression, and falls Referrals and appointments  In addition, I have reviewed and discussed with patient certain preventive protocols, quality metrics, and best practice recommendations. A written personalized care plan for preventive services as well as general preventive health recommendations were provided to patient.     Jeanne Marker, LPN   02/72/[id number]   Nurse Notes: none

## 2021-10-15 ENCOUNTER — Telehealth: Payer: Self-pay

## 2021-10-15 NOTE — Telephone Encounter (Signed)
Called patient no way to leave message

## 2021-10-22 ENCOUNTER — Other Ambulatory Visit: Payer: Self-pay

## 2021-10-22 ENCOUNTER — Telehealth: Payer: Self-pay

## 2021-10-22 DIAGNOSIS — Z1211 Encounter for screening for malignant neoplasm of colon: Secondary | ICD-10-CM

## 2021-10-22 MED ORDER — CLENPIQ 10-3.5-12 MG-GM -GM/160ML PO SOLN: 1.0000 | ORAL | 0 refills | Status: DC

## 2021-10-22 NOTE — Progress Notes (Signed)
Gastroenterology Pre-Procedure Review  Request Date: 11/17/2021 Requesting Physician: Dr. Vicente Males  PATIENT REVIEW QUESTIONS: The patient responded to the following health history questions as indicated:    1. Are you having any GI issues? no 2. Do you have a personal history of Polyps? no 3. Do you have a family history of Colon Cancer or Polyps? no 4. Diabetes Mellitus? no 5. Joint replacements in the past 12 months?no 6. Major health problems in the past 3 months?no 7. Any artificial heart valves, MVP, or defibrillator?no    MEDICATIONS & ALLERGIES:    Patient reports the following regarding taking any anticoagulation/antiplatelet therapy:   Plavix, Coumadin, Eliquis, Xarelto, Lovenox, Pradaxa, Brilinta, or Effient? no Aspirin? no  Patient confirms/reports the following medications:  Current Outpatient Medications  Medication Sig Dispense Refill   amLODipine (NORVASC) 10 MG tablet Take 1 tablet (10 mg total) by mouth daily. 90 tablet 3   ELDERBERRY PO Take by mouth. gummies     ezetimibe (ZETIA) 10 MG tablet Take 1 tablet (10 mg total) by mouth daily. 90 tablet 3   hydrALAZINE (APRESOLINE) 25 MG tablet TAKE 1 TABLET BY MOUTH THREE TIMES DAILY 270 tablet 0   Multiple Vitamin (MULITIVITAMIN WITH MINERALS) TABS Take 1 tablet by mouth daily.     valsartan (DIOVAN) 160 MG tablet Take 1 tablet by mouth once daily 90 tablet 0   No current facility-administered medications for this visit.    Patient confirms/reports the following allergies:  No Known Allergies  No orders of the defined types were placed in this encounter.   AUTHORIZATION INFORMATION Primary Insurance: 1D#: Group #:  Secondary Insurance: 1D#: Group #:  SCHEDULE INFORMATION: Date: 11/17/2021 Time: Location:[facility]

## 2021-10-22 NOTE — Telephone Encounter (Signed)
CALLED PATIENT NO ANSWER LEFT VOICEMAIL FOR A CALL BACK ? ?

## 2021-10-23 ENCOUNTER — Other Ambulatory Visit: Payer: Self-pay

## 2021-10-23 MED ORDER — NA SULFATE-K SULFATE-MG SULF 17.5-3.13-1.6 GM/177ML PO SOLN: 354.0000 mL | Freq: Once | ORAL | 0 refills | Status: AC

## 2021-10-24 ENCOUNTER — Encounter: Payer: Self-pay | Admitting: Internal Medicine

## 2021-10-24 ENCOUNTER — Ambulatory Visit (INDEPENDENT_AMBULATORY_CARE_PROVIDER_SITE_OTHER): Payer: Medicare Other | Admitting: Internal Medicine

## 2021-10-24 ENCOUNTER — Other Ambulatory Visit: Payer: Self-pay | Admitting: Internal Medicine

## 2021-10-24 VITALS — BP 146/68 | HR 99 | Temp 98.0°F | Resp 16 | Ht 66.0 in | Wt 169.7 lb

## 2021-10-24 DIAGNOSIS — I10 Essential (primary) hypertension: Secondary | ICD-10-CM | POA: Diagnosis not present

## 2021-10-24 DIAGNOSIS — E782 Mixed hyperlipidemia: Secondary | ICD-10-CM | POA: Diagnosis not present

## 2021-10-24 DIAGNOSIS — E1165 Type 2 diabetes mellitus with hyperglycemia: Secondary | ICD-10-CM | POA: Diagnosis not present

## 2021-10-24 DIAGNOSIS — Z23 Encounter for immunization: Secondary | ICD-10-CM | POA: Diagnosis not present

## 2021-10-24 MED ORDER — VALSARTAN 160 MG PO TABS: 160.0000 mg | ORAL_TABLET | Freq: Every day | ORAL | 0 refills | Status: DC

## 2021-10-24 NOTE — Progress Notes (Signed)
Established Patient Office Visit  Subjective:  Patient ID: Jeanne Hickman, female    DOB: December 15, 1945  Age: 76 y.o. MRN: [id number]  CC:  Chief Complaint  Patient presents with   Follow-up   Hypertension    Pt states has not taken Valsartan due to no refills.   Diabetes   Hyperlipidemia    HPI Jeanne Hickman presents for follow up on chronic medical conditions.   Pre-Diabetes: -Last A1c 6/22 6.2% -Eye exam: Follows with Ophtho within the last year, scheduled soon again  -Foot exam: at follow up  -Microalbumin: today -Statin: hx of intolerance  -PNA vaccine: yes -Denies symptoms of hypoglycemia, polyuria, polydipsia, numbness extremities, foot ulcers/trauma.   Hypertension: -Medications: Hydralazine 25 TID, Valsartan 160, Amlodipine 10 -Patient is compliant with above medications and reports no side effects. -Checking BP at home (average): 133/65 -Denies any SOB, CP, vision changes, LE edema or symptoms of hypotension  HLD: -Medications: Zetia 10, statin intolerance  -Patient is compliant with above medications and reports no side effects.  -Last lipid panel: 6/21: TC 221, HDL 53, triglycerides 100, LDL 147  The 10-year ASCVD risk score (Arnett DK, et al., 2019) is: 37.7%   Values used to calculate the score:     Age: 24 years     Sex: Female     Is Non-Hispanic African American: Yes     Diabetic: Yes     Tobacco smoker: No     Systolic Blood Pressure: 779 mmHg     Is BP treated: Yes     HDL Cholesterol: 53 mg/dL     Total Cholesterol: 221 mg/dL  Health Maintenance: -Blood work due today -Mammogram/DEXA: ordered but not scheduled -Colonoscopy: scheduled 11/17/21  Past Medical History:  Diagnosis Date   Cataract    Hypertension     Past Surgical History:  Procedure Laterality Date   ABDOMINAL HYSTERECTOMY      No family history on file.  Social History   Socioeconomic History   Marital status: Married    Spouse name: robert    Number of children: 2   Years of education: Not on file   Highest education level: Not on file  Occupational History   Occupation: retired  Tobacco Use   Smoking status: Former    Types: Cigarettes    Quit date: 02/28/1978    Years since quitting: 43.6   Smokeless tobacco: Never  Vaping Use   Vaping Use: Never used  Substance and Sexual Activity   Alcohol use: No   Drug use: No   Sexual activity: Yes    Partners: Male  Other Topics Concern   Not on file  Social History Narrative   Not on file   Social Determinants of Health   Financial Resource Strain: Low Risk    Difficulty of Paying Living Expenses: Not hard at all  Food Insecurity: No Food Insecurity   Worried About Charity fundraiser in the Last Year: Never true   [place] in the Last Year: Never true  Transportation Needs: No Transportation Needs   Lack of Transportation (Medical): No   Lack of Transportation (Non-Medical): No  Physical Activity: Insufficiently Active   Days of Exercise per Week: 3 days   Minutes of Exercise per Session: 30 min  Stress: No Stress Concern Present   Feeling of Stress : Only a little  Social Connections: Moderately Integrated   Frequency of Communication with Friends and Family: More than three times a  week   Frequency of Social Gatherings with Friends and Family: More than three times a week   Attends Religious Services: More than 4 times per year   Active Member of [employer] or Organizations: No   Attends Archivist Meetings: Never   Marital Status: Married  Human resources officer Violence: Not At Risk   Fear of Current or Ex-Partner: No   Emotionally Abused: No   Physically Abused: No   Sexually Abused: No    Outpatient Medications Prior to Visit  Medication Sig Dispense Refill   amLODipine (NORVASC) 10 MG tablet Take 1 tablet (10 mg total) by mouth daily. 90 tablet 3   ELDERBERRY PO Take by mouth. gummies     ezetimibe (ZETIA) 10 MG tablet Take 1 tablet (10 mg  total) by mouth daily. 90 tablet 3   hydrALAZINE (APRESOLINE) 25 MG tablet TAKE 1 TABLET BY MOUTH THREE TIMES DAILY 270 tablet 0   Multiple Vitamin (MULITIVITAMIN WITH MINERALS) TABS Take 1 tablet by mouth daily.     Sod Picosulfate-Mag Ox-Cit Acd (CLENPIQ) 10-3.5-12 MG-GM -GM/160ML SOLN Take 1 kit by mouth as directed. At 5 PM evening before procedure, drink 1 bottle of Clenpiq, hydrate, drink (5) 8 oz of water. Then do the same thing 5 hours prior to your procedure. 320 mL 0   valsartan (DIOVAN) 160 MG tablet Take 1 tablet by mouth once daily 90 tablet 0   No facility-administered medications prior to visit.    No Known Allergies  ROS Review of Systems  Constitutional:  Negative for chills and fever.  Eyes:  Negative for visual disturbance.  Respiratory:  Negative for cough and shortness of breath.   Cardiovascular:  Negative for chest pain and leg swelling.  Gastrointestinal:  Negative for abdominal pain, nausea and vomiting.  Neurological:  Negative for dizziness and headaches.     Objective:    Physical Exam Constitutional:      Appearance: Normal appearance.  HENT:     Head: Normocephalic and atraumatic.  Eyes:     Conjunctiva/sclera: Conjunctivae normal.  Cardiovascular:     Rate and Rhythm: Normal rate and regular rhythm.  Pulmonary:     Effort: Pulmonary effort is normal.     Breath sounds: Normal breath sounds.  Musculoskeletal:     Right lower leg: No edema.     Left lower leg: No edema.  Skin:    General: Skin is warm and dry.  Neurological:     General: No focal deficit present.     Mental Status: She is alert. Mental status is at baseline.  Psychiatric:        Mood and Affect: Mood normal.        Behavior: Behavior normal.    BP (!) 146/68    Pulse 99    Temp 98 F (36.7 C) (Oral)    Resp 16    Ht _0  (1.676 m)    Wt 169 lb 11.2 oz (77 kg)    SpO2 99%    BMI 27.39 kg/m  Wt Readings from Last 3 Encounters:  04/03/21 166 lb (75.3 kg)  10/02/20 170 lb  4.8 oz (77.2 kg)  04/04/20 172 lb 8 oz (78.2 kg)     Health Maintenance Due  Topic Date Due   DEXA SCAN  Never done   FOOT EXAM  Never done   MAMMOGRAM  Never done   COLONOSCOPY (Pts 45-25yr Insurance coverage will need to be confirmed)  Never done  Zoster Vaccines- Shingrix (1 of 2) Never done   OPHTHALMOLOGY EXAM  07/25/2020   COVID-19 Vaccine (4 - Booster for Pfizer series) 10/03/2020   INFLUENZA VACCINE  05/19/2021   HEMOGLOBIN A1C  10/03/2021    There are no preventive care reminders to display for this patient.  Lab Results  Component Value Date   TSH 1.87 03/03/2019   Lab Results  Component Value Date   WBC 5.9 10/02/2020   HGB 12.5 10/02/2020   HCT 38.7 10/02/2020   MCV 83.0 10/02/2020   PLT 296 10/02/2020   Lab Results  Component Value Date   NA 141 10/02/2020   K 3.8 10/02/2020   CO2 31 10/02/2020   GLUCOSE 113 (H) 10/02/2020   BUN 15 10/02/2020   CREATININE 0.73 10/02/2020   BILITOT 0.6 10/02/2020   AST 16 10/02/2020   ALT 18 10/02/2020   PROT 7.1 10/02/2020   CALCIUM 9.3 10/02/2020   Lab Results  Component Value Date   CHOL 221 (H) 04/04/2020   Lab Results  Component Value Date   HDL 53 04/04/2020   Lab Results  Component Value Date   LDLCALC 147 (H) 04/04/2020   Lab Results  Component Value Date   TRIG 100 04/04/2020   Lab Results  Component Value Date   CHOLHDL 4.2 04/04/2020   Lab Results  Component Value Date   HGBA1C 6.6 (A) 04/03/2021      Assessment & Plan:   1. Essential hypertension: BP a little high, ran out of ARB. Refilled today, continue all medications. Due for blood work. Follow up in 6 months.   - CBC w/Diff/Platelet - COMPLETE METABOLIC PANEL WITH GFR - valsartan (DIOVAN) 160 MG tablet; Take 1 tablet (160 mg total) by mouth daily.  Dispense: 90 tablet; Refill: 0  2. Type 2 diabetes mellitus with hyperglycemia, without long-term current use of insulin ([place]): Recheck A1c, urine microalbumin.   - CBC  w/Diff/Platelet - COMPLETE METABOLIC PANEL WITH GFR - HgB A1c - Urine Microalbumin w/creat. ratio  3. Mixed hyperlipidemia: Discussed last lipid panel and increased ASCVD risk. Statin intolerance, Zetia added since last lipid panel so will recheck today.  - Lipid Profile  4. Need for influenza vaccination: Flu vaccine administered today.  - Flu Vaccine QUAD High Dose(Fluad)   Follow-up: Return in about 6 months (around 04/23/2022).    Teodora Medici, DO

## 2021-10-24 NOTE — Patient Instructions (Signed)
It was great seeing you today!  Plan discussed at today's visit: -Blood work ordered today, results will be uploaded to [facility].  -Urine check today -Refills sent to pharmacy -Planning colonoscopy, mammogram, bone scan  Follow up in: 6 months  Take care and let us know if you have any questions or concerns prior to your next visit.  Dr. Rosana Berger

## 2021-10-25 LAB — CBC WITH DIFFERENTIAL/PLATELET
Absolute Monocytes: 353 cells/uL (ref 200–950)
Basophils Absolute: 62 cells/uL (ref 0–200)
Basophils Relative: 1.1 %
Eosinophils Absolute: 62 cells/uL (ref 15–500)
Eosinophils Relative: 1.1 %
HCT: 40.7 % (ref 35.0–45.0)
Hemoglobin: 12.9 g/dL (ref 11.7–15.5)
Lymphs Abs: 1714 cells/uL (ref 850–3900)
MCH: 26.8 pg — ABNORMAL LOW (ref 27.0–33.0)
MCHC: 31.7 g/dL — ABNORMAL LOW (ref 32.0–36.0)
MCV: 84.6 fL (ref 80.0–100.0)
MPV: 11.2 fL (ref 7.5–12.5)
Monocytes Relative: 6.3 %
Neutro Abs: 3410 cells/uL (ref 1500–7800)
Neutrophils Relative %: 60.9 %
Platelets: 269 10*3/uL (ref 140–400)
RBC: 4.81 10*6/uL (ref 3.80–5.10)
RDW: 14 % (ref 11.0–15.0)
Total Lymphocyte: 30.6 %
WBC: 5.6 10*3/uL (ref 3.8–10.8)

## 2021-10-25 LAB — COMPLETE METABOLIC PANEL WITH GFR
AG Ratio: 1.3 (calc) (ref 1.0–2.5)
ALT: 16 U/L (ref 6–29)
AST: 16 U/L (ref 10–35)
Albumin: 4.2 g/dL (ref 3.6–5.1)
Alkaline phosphatase (APISO): 69 U/L (ref 37–153)
BUN: 11 mg/dL (ref 7–25)
CO2: 29 mmol/L (ref 20–32)
Calcium: 9.3 mg/dL (ref 8.6–10.4)
Chloride: 107 mmol/L (ref 98–110)
Creat: 0.68 mg/dL (ref 0.60–1.00)
Globulin: 3.2 g/dL (calc) (ref 1.9–3.7)
Glucose, Bld: 119 mg/dL — ABNORMAL HIGH (ref 65–99)
Potassium: 3.7 mmol/L (ref 3.5–5.3)
Sodium: 145 mmol/L (ref 135–146)
Total Bilirubin: 0.4 mg/dL (ref 0.2–1.2)
Total Protein: 7.4 g/dL (ref 6.1–8.1)
eGFR: 91 mL/min/{1.73_m2} (ref >=60)

## 2021-10-25 LAB — LIPID PANEL
Cholesterol: 199 mg/dL (ref <200)
HDL: 62 mg/dL (ref >=50)
LDL Cholesterol (Calc): 119 mg/dL (calc) — ABNORMAL HIGH
Non-HDL Cholesterol (Calc): 137 mg/dL (calc) — ABNORMAL HIGH (ref <130)
Total CHOL/HDL Ratio: 3.2 (calc) (ref <5.0)
Triglycerides: 83 mg/dL (ref <150)

## 2021-10-25 LAB — MICROALBUMIN / CREATININE URINE RATIO
Creatinine, Urine: 83 mg/dL (ref 20–275)
Microalb Creat Ratio: 306 mcg/mg creat — ABNORMAL HIGH (ref <30)
Microalb, Ur: 25.4 mg/dL

## 2021-10-25 LAB — HEMOGLOBIN A1C
Hgb A1c MFr Bld: 6.6 % of total Hgb — ABNORMAL HIGH (ref <5.7)
Mean Plasma Glucose: 143 mg/dL
eAG (mmol/L): 7.9 mmol/L

## 2021-11-08 ENCOUNTER — Other Ambulatory Visit: Payer: Self-pay | Admitting: Unknown Physician Specialty

## 2021-11-08 DIAGNOSIS — I10 Essential (primary) hypertension: Secondary | ICD-10-CM

## 2021-11-08 NOTE — Telephone Encounter (Signed)
Requested Prescriptions  Pending Prescriptions Disp Refills   hydrALAZINE (APRESOLINE) 25 MG tablet [Pharmacy Med Name: hydrALAZINE HCl 25 MG Oral Tablet] 270 tablet 1    Sig: TAKE 1 TABLET BY MOUTH THREE TIMES DAILY     Cardiovascular:  Vasodilators Failed - 11/08/2021 11:18 AM      Failed - Last BP in normal range    BP Readings from Last 1 Encounters:  10/24/21 (!) 146/68         Passed - HCT in normal range and within 360 days    HCT  Date Value Ref Range Status  10/24/2021 40.7 35.0 - 45.0 % Final         Passed - HGB in normal range and within 360 days    Hemoglobin  Date Value Ref Range Status  10/24/2021 12.9 11.7 - 15.5 g/dL Final         Passed - RBC in normal range and within 360 days    RBC  Date Value Ref Range Status  10/24/2021 4.81 3.80 - 5.10 Million/uL Final         Passed - WBC in normal range and within 360 days    WBC  Date Value Ref Range Status  10/24/2021 5.6 3.8 - 10.8 Thousand/uL Final         Passed - PLT in normal range and within 360 days    Platelets  Date Value Ref Range Status  10/24/2021 269 140 - 400 Thousand/uL Final         Passed - Valid encounter within last 12 months    Recent Outpatient Visits          2 weeks ago Essential hypertension   [place], DO   7 months ago New onset type 2 diabetes mellitus [facility])   [place], NP   1 year ago Essential hypertension   [facility] Delsa Grana, PA-C   1 year ago Essential hypertension   [facility] Delsa Grana, PA-C   2 years ago Essential hypertension   [place], Astrid Divine, [place]      Future Appointments            In 5 months Teodora Medici, [facility], [place]   In 11 months  [facility], [facility]

## 2021-11-11 ENCOUNTER — Telehealth: Payer: Self-pay

## 2021-11-11 NOTE — Telephone Encounter (Signed)
Patient left a voicemail stating she has not received her paperwork for her colonoscopy on 11/17/21. Please call patient

## 2021-11-11 NOTE — Telephone Encounter (Signed)
Patient called and wanted her instructions I reprinted and sent again and I also gave her instructions over the phone

## 2021-11-14 ENCOUNTER — Encounter: Payer: Self-pay | Admitting: Gastroenterology

## 2021-11-17 ENCOUNTER — Ambulatory Visit: Payer: Medicare Other | Admitting: Registered Nurse

## 2021-11-17 ENCOUNTER — Ambulatory Visit
Admission: RE | Admit: 2021-11-17 | Discharge: 2021-11-17 | Disposition: A | Discharge status: Home / Self Care | Payer: Medicare Other | Source: Ambulatory Visit | Attending: Gastroenterology | Admitting: Gastroenterology

## 2021-11-17 ENCOUNTER — Encounter
Admission: RE | Disposition: A | Discharge status: Home / Self Care | Payer: Self-pay | Source: Ambulatory Visit | Attending: Gastroenterology

## 2021-11-17 ENCOUNTER — Encounter: Payer: Self-pay | Admitting: Gastroenterology

## 2021-11-17 DIAGNOSIS — K635 Polyp of colon: Secondary | ICD-10-CM

## 2021-11-17 DIAGNOSIS — K5730 Diverticulosis of large intestine without perforation or abscess without bleeding: Secondary | ICD-10-CM | POA: Diagnosis not present

## 2021-11-17 DIAGNOSIS — D122 Benign neoplasm of ascending colon: Secondary | ICD-10-CM | POA: Insufficient documentation

## 2021-11-17 DIAGNOSIS — Z1211 Encounter for screening for malignant neoplasm of colon: Secondary | ICD-10-CM | POA: Diagnosis present

## 2021-11-17 DIAGNOSIS — Z87891 Personal history of nicotine dependence: Secondary | ICD-10-CM | POA: Diagnosis not present

## 2021-11-17 HISTORY — PX: COLONOSCOPY WITH PROPOFOL: SHX5780

## 2021-11-17 SURGERY — COLONOSCOPY WITH PROPOFOL
Anesthesia: General

## 2021-11-17 MED ORDER — SODIUM CHLORIDE 0.9 % IV SOLN: INTRAVENOUS | Status: DC | PRN

## 2021-11-17 MED ORDER — LIDOCAINE HCL (CARDIAC) PF 100 MG/5ML IV SOSY
PREFILLED_SYRINGE | INTRAVENOUS | Status: DC | PRN
  Administered 2021-11-17: 100 mg via INTRAVENOUS

## 2021-11-17 MED ORDER — STERILE WATER FOR IRRIGATION IR SOLN
Status: DC | PRN
  Administered 2021-11-17 (×2): 60 mL

## 2021-11-17 MED ORDER — SODIUM CHLORIDE 0.9 % IV SOLN: INTRAVENOUS | Status: DC

## 2021-11-17 MED ORDER — LIDOCAINE HCL (PF) 2 % IJ SOLN
INTRAMUSCULAR | Status: AC
  Filled 2021-11-17: qty 30

## 2021-11-17 MED ORDER — PROPOFOL 10 MG/ML IV BOLUS
INTRAVENOUS | Status: DC | PRN
  Administered 2021-11-17: 70 mg via INTRAVENOUS

## 2021-11-17 MED ORDER — PHENYLEPHRINE HCL-NACL 20-0.9 MG/250ML-% IV SOLN
INTRAVENOUS | Status: AC
  Filled 2021-11-17: qty 250

## 2021-11-17 MED ORDER — PROPOFOL 500 MG/50ML IV EMUL
INTRAVENOUS | Status: DC | PRN
  Administered 2021-11-17: 150 ug/kg/min via INTRAVENOUS

## 2021-11-17 MED ORDER — PHENYLEPHRINE HCL (PRESSORS) 10 MG/ML IV SOLN
INTRAVENOUS | Status: DC | PRN
  Administered 2021-11-17: 100 ug via INTRAVENOUS

## 2021-11-17 MED ORDER — PROPOFOL 500 MG/50ML IV EMUL
INTRAVENOUS | Status: AC
  Filled 2021-11-17: qty 350

## 2021-11-17 NOTE — H&P (Signed)
Jonathon Bellows, MD [address], [place], [place], [place], [address] [address], [place], [place], [place], [address] Phone: [phone]  Fax: [phone]  Primary Care Physician:  Delsa Grana, PA-C   Pre-Procedure History & Physical: HPI:  Jeanne Hickman is a 76 y.o. female is here for an colonoscopy.   Past Medical History:  Diagnosis Date   Cataract    Hypertension     Past Surgical History:  Procedure Laterality Date   ABDOMINAL HYSTERECTOMY      Prior to Admission medications   Medication Sig Start Date End Date Taking? Authorizing Provider  amLODipine (NORVASC) 10 MG tablet Take 1 tablet (10 mg total) by mouth daily. 04/03/21  Yes Kathrine Haddock, NP  hydrALAZINE (APRESOLINE) 25 MG tablet TAKE 1 TABLET BY MOUTH THREE TIMES DAILY 11/08/21  Yes Delsa Grana, PA-C  valsartan (DIOVAN) 160 MG tablet Take 1 tablet (160 mg total) by mouth daily. 10/24/21  Yes Teodora Medici, DO  ELDERBERRY PO Take by mouth. gummies    [provider]  ezetimibe (ZETIA) 10 MG tablet Take 1 tablet (10 mg total) by mouth daily. 04/03/21   Kathrine Haddock, NP  Multiple Vitamin (MULITIVITAMIN WITH MINERALS) TABS Take 1 tablet by mouth daily.    [provider]  Sod Picosulfate-Mag Ox-Cit Acd (CLENPIQ) 10-3.5-12 MG-GM -GM/160ML SOLN Take 1 kit by mouth as directed. At 5 PM evening before procedure, drink 1 bottle of Clenpiq, hydrate, drink (5) 8 oz of water. Then do the same thing 5 hours prior to your procedure. Patient not taking: Reported on 10/24/2021 10/22/21   Jonathon Bellows, MD    Allergies as of 10/22/2021   (No Known Allergies)    History reviewed. No pertinent family history.  Social History   Socioeconomic History   Marital status: Married    Spouse name: robert   Number of children: 2   Years of education: Not on file   Highest education level: Not on file  Occupational History   Occupation: retired  Tobacco Use   Smoking status: Former    Types:  Cigarettes    Quit date: 02/28/1978    Years since quitting: 43.7   Smokeless tobacco: Never  Vaping Use   Vaping Use: Never used  Substance and Sexual Activity   Alcohol use: No   Drug use: No   Sexual activity: Yes    Partners: Male  Other Topics Concern   Not on file  Social History Narrative   Not on file   Social Determinants of Health   Financial Resource Strain: Low Risk    Difficulty of Paying Living Expenses: Not hard at all  Food Insecurity: No Food Insecurity   Worried About Charity fundraiser in the Last Year: Never true   [place] in the Last Year: Never true  Transportation Needs: No Transportation Needs   Lack of Transportation (Medical): No   Lack of Transportation (Non-Medical): No  Physical Activity: Insufficiently Active   Days of Exercise per Week: 3 days   Minutes of Exercise per Session: 30 min  Stress: No Stress Concern Present   Feeling of Stress : Only a little  Social Connections: Moderately Integrated   Frequency of Communication with Friends and Family: More than three times a week   Frequency of Social Gatherings with Friends and Family: More than three times a week   Attends Religious Services: More than 4 times per year   Active Member of [employer]  or Organizations: No   Attends Archivist Meetings: Never   Marital Status: Married  Human resources officer Violence: Not At Risk   Fear of Current or Ex-Partner: No   Emotionally Abused: No   Physically Abused: No   Sexually Abused: No    Review of Systems: See HPI, otherwise negative ROS  Physical Exam: BP (!) 166/74    Pulse 85    Temp (!) 96.1 F (35.6 C) (Temporal)    Resp 16    Ht _0  (1.676 m)    Wt 76.2 kg    SpO2 99%    BMI 27.12 kg/m  General:   Alert,  pleasant and cooperative in NAD Head:  Normocephalic and atraumatic. Neck:  Supple; no masses or thyromegaly. Lungs:  Clear throughout to auscultation, normal respiratory effort.    Heart:  +S1, +S2, Regular rate and  rhythm, No edema. Abdomen:  Soft, nontender and nondistended. Normal bowel sounds, without guarding, and without rebound.   Neurologic:  Alert and  oriented x4;  grossly normal neurologically.  Impression/Plan: Frankey Poot is here for an colonoscopy to be performed for Screening colonoscopy average risk   Risks, benefits, limitations, and alternatives regarding  colonoscopy have been reviewed with the patient.  Questions have been answered.  All parties agreeable.   Jonathon Bellows, MD  11/17/2021, 8:05 AM

## 2021-11-17 NOTE — Anesthesia Postprocedure Evaluation (Signed)
Anesthesia Post Note  Patient: Jeanne Hickman  Procedure(s) Performed: COLONOSCOPY WITH PROPOFOL  Patient location during evaluation: Endoscopy Anesthesia Type: General Level of consciousness: awake and alert Pain management: pain level controlled Vital Signs Assessment: post-procedure vital signs reviewed and stable Respiratory status: spontaneous breathing, nonlabored ventilation, respiratory function stable and patient connected to nasal cannula oxygen Cardiovascular status: blood pressure returned to baseline and stable Postop Assessment: no apparent nausea or vomiting Anesthetic complications: no   No notable events documented.   Last Vitals:  Vitals:   11/17/21 0834 11/17/21 0844  BP: (!) 108/56 113/67  Pulse:    Resp: 17   Temp: (!) 35.6 C (!) 35.6 C  SpO2: 95%     Last Pain:  Vitals:   11/17/21 0854  TempSrc:   PainSc: 0-No pain                 Arita Miss

## 2021-11-17 NOTE — Anesthesia Preprocedure Evaluation (Signed)
Anesthesia Evaluation  Patient identified by MRN, date of birth, ID band Patient awake    Reviewed: Allergy & Precautions, NPO status , Patient's Chart, lab work & pertinent test results  History of Anesthesia Complications Negative for: history of anesthetic complications  Airway Mallampati: II  TM Distance: >3 FB Neck ROM: Full    Dental  (+) Lower Dentures, Upper Dentures   Pulmonary neg pulmonary ROS, neg sleep apnea, neg COPD, Patient abstained from smoking.Not current smoker, former smoker,    Pulmonary exam normal breath sounds clear to auscultation       Cardiovascular Exercise Tolerance: Good METShypertension, (-) CAD and (-) Past MI (-) dysrhythmias  Rhythm:Regular Rate:Normal - Systolic murmurs    Neuro/Psych negative neurological ROS  negative psych ROS   GI/Hepatic neg GERD  ,(+)     (-) substance abuse  ,   Endo/Other  diabetes  Renal/GU Renal disease     Musculoskeletal   Abdominal   Peds  Hematology   Anesthesia Other Findings Past Medical History: No date: Cataract No date: Hypertension  Reproductive/Obstetrics                             Anesthesia Physical Anesthesia Plan  ASA: 2  Anesthesia Plan: General   Post-op Pain Management: Minimal or no pain anticipated   Induction: Intravenous  PONV Risk Score and Plan: 3 and Propofol infusion, TIVA and Ondansetron  Airway Management Planned: Nasal Cannula  Additional Equipment: None  Intra-op Plan:   Post-operative Plan:   Informed Consent: I have reviewed the patients History and Physical, chart, labs and discussed the procedure including the risks, benefits and alternatives for the proposed anesthesia with the patient or authorized representative who has indicated his/her understanding and acceptance.     Dental advisory given  Plan Discussed with: CRNA and Surgeon  Anesthesia Plan Comments:  (Discussed risks of anesthesia with patient, including possibility of difficulty with spontaneous ventilation under anesthesia necessitating airway intervention, PONV, and rare risks such as cardiac or respiratory or neurological events, and allergic reactions. Discussed the role of CRNA in patient's perioperative care. Patient understands.)        Anesthesia Quick Evaluation

## 2021-11-17 NOTE — Op Note (Signed)
[facility] Gastroenterology Patient Name: Jeanne Hickman Procedure Date: 11/17/2021 7:25 AM MRN: [id number] Account #: [id number] Date of Birth: [birth year] Admit Type: Outpatient Age: 76 Room: [facility] ENDO ROOM 4 Gender: Female Note Status: Finalized Instrument Name: Jasper Riling [id number] Procedure:             Colonoscopy Indications:           Screening for colorectal malignant neoplasm Providers:             Jonathon Bellows MD, MD Referring MD:          Delsa Grana (Referring MD) Medicines:             Monitored Anesthesia Care Complications:         No immediate complications. Procedure:             Pre-Anesthesia Assessment:                        - Prior to the procedure, a History and Physical was                         performed, and patient medications, allergies and                         sensitivities were reviewed. The patient's tolerance                         of previous anesthesia was reviewed.                        - The risks and benefits of the procedure and the                         sedation options and risks were discussed with the                         patient. All questions were answered and informed                         consent was obtained.                        - ASA Grade Assessment: II - A patient with mild                         systemic disease.                        After obtaining informed consent, the colonoscope was                         passed under direct vision. Throughout the procedure,                         the patient's blood pressure, pulse, and oxygen                         saturations were monitored continuously. The                         Colonoscope was introduced through  the anus and                         advanced to the the cecum, identified by the                         appendiceal orifice. The colonoscopy was performed                         with ease. The patient tolerated the procedure well.                          The quality of the bowel preparation was excellent. Findings:      The perianal and digital rectal examinations were normal.      Two sessile polyps were found in the ascending colon. The polyps were 4       to 5 mm in size. These polyps were removed with a cold snare. Resection       and retrieval were complete.      Multiple small-mouthed diverticula were found in the entire colon.      The exam was otherwise without abnormality on direct and retroflexion       views. Impression:            - Two 4 to 5 mm polyps in the ascending colon, removed                         with a cold snare. Resected and retrieved.                        - Diverticulosis in the entire examined colon.                        - The examination was otherwise normal on direct and                         retroflexion views. Recommendation:        - Discharge patient to home (with escort).                        - Resume previous diet.                        - Continue present medications.                        - Await pathology results.                        - Repeat colonoscopy is not recommended due to current                         age (90 years or older) for surveillance. Procedure Code(s):     --- Professional ---                        [phone], Colonoscopy, flexible; with removal of                         tumor(s), polyp(s), or other lesion(s) by snare  technique Diagnosis Code(s):     --- Professional ---                        Z12.11, Encounter for screening for malignant neoplasm                         of colon                        K63.5, Polyp of colon                        K57.30, Diverticulosis of large intestine without                         perforation or abscess without bleeding CPT copyright 2019 American Medical Association. All rights reserved. The codes documented in this report are preliminary and upon coder review may  be revised to meet  current compliance requirements. Jonathon Bellows, MD Jonathon Bellows MD, MD 11/17/2021 8:34:31 AM This report has been signed electronically. Number of Addenda: 0 Note Initiated On: 11/17/2021 7:25 AM Scope Withdrawal Time: 0 hours 12 minutes 48 seconds  Total Procedure Duration: 0 hours 18 minutes 19 seconds  Estimated Blood Loss:  Estimated blood loss: none.      [facility]

## 2021-11-17 NOTE — Transfer of Care (Signed)
Immediate Anesthesia Transfer of Care Note  Patient: Jeanne Hickman  Procedure(s) Performed: COLONOSCOPY WITH PROPOFOL  Patient Location: Endoscopy Unit  Anesthesia Type:General  Level of Consciousness: drowsy  Airway & Oxygen Therapy: Patient Spontanous Breathing and Patient connected to nasal cannula oxygen  Post-op Assessment: Report given to RN and Post -op Vital signs reviewed and stable  Post vital signs: Reviewed and stable  Last Vitals:  Vitals Value Taken Time  BP 108/56 11/17/21 0834  Temp    Pulse 64 11/17/21 0836  Resp 17 11/17/21 0836  SpO2 93 % 11/17/21 0836  Vitals shown include unvalidated device data.  Last Pain:  Vitals:   11/17/21 0834  TempSrc: Temporal  PainSc: Asleep         Complications: No notable events documented.

## 2021-11-18 LAB — SURGICAL PATHOLOGY

## 2021-11-19 ENCOUNTER — Encounter: Payer: Self-pay | Admitting: Gastroenterology

## 2022-04-02 LAB — HEMOGLOBIN A1C: Hemoglobin A1C: 6.1

## 2022-04-22 ENCOUNTER — Other Ambulatory Visit: Payer: Self-pay | Admitting: Family Medicine

## 2022-04-22 DIAGNOSIS — I10 Essential (primary) hypertension: Secondary | ICD-10-CM

## 2022-04-23 ENCOUNTER — Other Ambulatory Visit: Payer: Self-pay | Admitting: Unknown Physician Specialty

## 2022-04-23 DIAGNOSIS — E782 Mixed hyperlipidemia: Secondary | ICD-10-CM

## 2022-04-24 NOTE — Telephone Encounter (Signed)
Requested Prescriptions  Pending Prescriptions Disp Refills  . ezetimibe (ZETIA) 10 MG tablet [Pharmacy Med Name: Ezetimibe 10 MG Oral Tablet] 90 tablet 1    Sig: Take 1 tablet by mouth once daily     Cardiovascular:  Antilipid - Sterol Transport Inhibitors Failed - 04/23/2022 12:51 PM      Failed - Lipid Panel in normal range within the last 12 months    Cholesterol  Date Value Ref Range Status  10/24/2021 199 <200 mg/dL Final   LDL Cholesterol (Calc)  Date Value Ref Range Status  10/24/2021 119 (H) mg/dL (calc) Final    Comment:    Reference range: <100 . Desirable range <100 mg/dL for primary prevention;   <70 mg/dL for patients with CHD or diabetic patients  with > or = 2 CHD risk factors. Marland Kitchen LDL-C is now calculated using the Martin-Hopkins  calculation, which is a validated novel method providing  better accuracy than the Friedewald equation in the  estimation of LDL-C.  Cresenciano Genre et al. Annamaria Helling. 7169;678(93): 2061-2068  (http://education.[facility].com/faq/FAQ164)    HDL  Date Value Ref Range Status  10/24/2021 62 > OR = 50 mg/dL Final   Triglycerides  Date Value Ref Range Status  10/24/2021 83 <150 mg/dL Final         Passed - AST in normal range and within 360 days    AST  Date Value Ref Range Status  10/24/2021 16 10 - 35 U/L Final         Passed - ALT in normal range and within 360 days    ALT  Date Value Ref Range Status  10/24/2021 16 6 - 29 U/L Final         Passed - Patient is not pregnant      Passed - Valid encounter within last 12 months    Recent Outpatient Visits          6 months ago Essential hypertension   [place], DO   1 year ago New onset type 2 diabetes mellitus [facility])   [place], NP   1 year ago Essential hypertension   [facility] Delsa Grana, PA-C   2 years ago Essential hypertension   [facility] Delsa Grana, PA-C   2 years ago Essential hypertension   [place], Astrid Divine, [place]            In 5 days Teodora Medici, [facility], [place]   In 5 months  [facility], [facility]

## 2022-04-28 NOTE — Progress Notes (Deleted)
Established Patient Office Visit  Subjective:  Patient ID: Jeanne Hickman, female    DOB: [birth year]  Age: 76 y.o. MRN: [id number]  CC:  No chief complaint on file.   HPI KEMBA HOPPES presents for follow up on chronic medical conditions.   Pre-Diabetes: -Last A1c 5/23 6.1% -Not on any medications currently  -Eye exam: Follows with Ophtho within the last year, scheduled soon again  -Foot exam: at follow up  -Microalbumin: UTD 1/23  -Statin: hx of intolerance  -PNA vaccine: yes -Denies symptoms of hypoglycemia, polyuria, polydipsia, numbness extremities, foot ulcers/trauma.   Hypertension: -Medications: Hydralazine 25 TID, Valsartan 160, Amlodipine 10 mg -Patient is compliant with above medications and reports no side effects. -Checking BP at home (average): 133/65 -Denies any SOB, CP, vision changes, LE edema or symptoms of hypotension  HLD: -Medications: Zetia 10, statin intolerance  -Patient is compliant with above medications and reports no side effects.  -Last lipid panel:  Lipid Panel     Component Value Date/Time   CHOL 199 10/24/2021 0919   TRIG 83 10/24/2021 0919   HDL 62 10/24/2021 0919   CHOLHDL 3.2 10/24/2021 0919   LDLCALC 119 (H) 10/24/2021 0919   Health Maintenance: -Blood work UTD -Mammogram/DEXA: ordered but not scheduled ? -Colonoscopy: 11/17/21 - does not recommend repeat colonoscopy   Past Medical History:  Diagnosis Date   Cataract    Hypertension     Past Surgical History:  Procedure Laterality Date   ABDOMINAL HYSTERECTOMY     COLONOSCOPY WITH PROPOFOL N/A 11/17/2021   Procedure: COLONOSCOPY WITH PROPOFOL;  Surgeon: Jonathon Bellows, MD;  Location: [facility] ENDOSCOPY;  Service: Gastroenterology;  Laterality: N/A;    No family history on file.  Social History   Socioeconomic History   Marital status: Married    Spouse name: robert   Number of children: 2   Years of education: Not on file   Highest education level: Not on file   Occupational History   Occupation: retired  Tobacco Use   Smoking status: Former    Types: Cigarettes    Quit date: 02/28/1978    Years since quitting: 44.1   Smokeless tobacco: Never  Vaping Use   Vaping Use: Never used  Substance and Sexual Activity   Alcohol use: No   Drug use: No   Sexual activity: Yes    Partners: Male  Other Topics Concern   Not on file  Social History Narrative   Not on file   Social Determinants of Health   Financial Resource Strain: Low Risk  (10/14/2021)   Overall Financial Resource Strain (CARDIA)    Difficulty of Paying Living Expenses: Not hard at all  Food Insecurity: No Food Insecurity (10/14/2021)   Hunger Vital Sign    Worried About Running Out of Food in the Last Year: Never true    [place] in the Last Year: Never true  Transportation Needs: No Transportation Needs (10/14/2021)   PRAPARE - Hydrologist (Medical): No    Lack of Transportation (Non-Medical): No  Physical Activity: Insufficiently Active (10/14/2021)   Exercise Vital Sign    Days of Exercise per Week: 3 days    Minutes of Exercise per Session: 30 min  Stress: No Stress Concern Present (10/14/2021)   [place]    Feeling of Stress : Only a little  Social Connections: Moderately Integrated (10/14/2021)   Social Connection and Isolation Panel [  NHANES]    Frequency of Communication with Friends and Family: More than three times a week    Frequency of Social Gatherings with Friends and Family: More than three times a week    Attends Religious Services: More than 4 times per year    Active Member of [employer] or Organizations: No    Attends Archivist Meetings: Never    Marital Status: Married  Human resources officer Violence: Not At Risk (10/14/2021)   Humiliation, Afraid, Rape, and Kick questionnaire    Fear of Current or Ex-Partner: No    Emotionally Abused: No     Physically Abused: No    Sexually Abused: No    Outpatient Medications Prior to Visit  Medication Sig Dispense Refill   amLODipine (NORVASC) 10 MG tablet Take 1 tablet (10 mg total) by mouth daily. 90 tablet 3   ELDERBERRY PO Take by mouth. gummies     ezetimibe (ZETIA) 10 MG tablet Take 1 tablet by mouth once daily 90 tablet 1   hydrALAZINE (APRESOLINE) 25 MG tablet TAKE 1 TABLET BY MOUTH THREE TIMES DAILY 270 tablet 1   Multiple Vitamin (MULITIVITAMIN WITH MINERALS) TABS Take 1 tablet by mouth daily.     Sod Picosulfate-Mag Ox-Cit Acd (CLENPIQ) 10-3.5-12 MG-GM -GM/160ML SOLN Take 1 kit by mouth as directed. At 5 PM evening before procedure, drink 1 bottle of Clenpiq, hydrate, drink (5) 8 oz of water. Then do the same thing 5 hours prior to your procedure. (Patient not taking: Reported on 10/24/2021) 320 mL 0   valsartan (DIOVAN) 160 MG tablet Take 1 tablet by mouth once daily 90 tablet 0   No facility-administered medications prior to visit.    No Known Allergies  ROS Review of Systems  Constitutional:  Negative for chills and fever.  Eyes:  Negative for visual disturbance.  Respiratory:  Negative for cough and shortness of breath.   Cardiovascular:  Negative for chest pain and leg swelling.  Gastrointestinal:  Negative for abdominal pain, nausea and vomiting.  Neurological:  Negative for dizziness and headaches.      Objective:    Physical Exam Constitutional:      Appearance: Normal appearance.  HENT:     Head: Normocephalic and atraumatic.  Eyes:     Conjunctiva/sclera: Conjunctivae normal.  Cardiovascular:     Rate and Rhythm: Normal rate and regular rhythm.  Pulmonary:     Effort: Pulmonary effort is normal.     Breath sounds: Normal breath sounds.  Musculoskeletal:     Right lower leg: No edema.     Left lower leg: No edema.  Skin:    General: Skin is warm and dry.  Neurological:     General: No focal deficit present.     Mental Status: She is alert. Mental  status is at baseline.  Psychiatric:        Mood and Affect: Mood normal.        Behavior: Behavior normal.     There were no vitals taken for this visit. Wt Readings from Last 3 Encounters:  11/17/21 168 lb (76.2 kg)  10/24/21 169 lb 11.2 oz (77 kg)  04/03/21 166 lb (75.3 kg)     Health Maintenance Due  Topic Date Due   DEXA SCAN  Never done   FOOT EXAM  Never done   MAMMOGRAM  Never done   Zoster Vaccines- Shingrix (1 of 2) Never done   OPHTHALMOLOGY EXAM  07/25/2020   COVID-19 Vaccine (4 - [employer]  series) 10/03/2020    There are no preventive care reminders to display for this patient.  Lab Results  Component Value Date   TSH 1.87 03/03/2019   Lab Results  Component Value Date   WBC 5.6 10/24/2021   HGB 12.9 10/24/2021   HCT 40.7 10/24/2021   MCV 84.6 10/24/2021   PLT 269 10/24/2021   Lab Results  Component Value Date   NA 145 10/24/2021   K 3.7 10/24/2021   CO2 29 10/24/2021   GLUCOSE 119 (H) 10/24/2021   BUN 11 10/24/2021   CREATININE 0.68 10/24/2021   BILITOT 0.4 10/24/2021   AST 16 10/24/2021   ALT 16 10/24/2021   PROT 7.4 10/24/2021   CALCIUM 9.3 10/24/2021   EGFR 91 10/24/2021   Lab Results  Component Value Date   CHOL 199 10/24/2021   Lab Results  Component Value Date   HDL 62 10/24/2021   Lab Results  Component Value Date   LDLCALC 119 (H) 10/24/2021   Lab Results  Component Value Date   TRIG 83 10/24/2021   Lab Results  Component Value Date   CHOLHDL 3.2 10/24/2021   Lab Results  Component Value Date   HGBA1C 6.1 04/02/2022      Assessment & Plan:   1. Essential hypertension: BP a little high, ran out of ARB. Refilled today, continue all medications. Due for blood work. Follow up in 6 months.   - CBC w/Diff/Platelet - COMPLETE METABOLIC PANEL WITH GFR - valsartan (DIOVAN) 160 MG tablet; Take 1 tablet (160 mg total) by mouth daily.  Dispense: 90 tablet; Refill: 0  2. Type 2 diabetes mellitus with hyperglycemia, without  long-term current use of insulin ([place]): Recheck A1c, urine microalbumin.   - CBC w/Diff/Platelet - COMPLETE METABOLIC PANEL WITH GFR - HgB A1c - Urine Microalbumin w/creat. ratio  3. Mixed hyperlipidemia: Discussed last lipid panel and increased ASCVD risk. Statin intolerance, Zetia added since last lipid panel so will recheck today.  - Lipid Profile  4. Need for influenza vaccination: Flu vaccine administered today.  - Flu Vaccine QUAD High Dose(Fluad)   Follow-up: No follow-ups on file.    Teodora Medici, DO

## 2022-04-29 ENCOUNTER — Ambulatory Visit: Payer: Medicare Other | Admitting: Internal Medicine

## 2022-05-15 ENCOUNTER — Other Ambulatory Visit: Payer: Self-pay | Admitting: Unknown Physician Specialty

## 2022-05-15 DIAGNOSIS — I10 Essential (primary) hypertension: Secondary | ICD-10-CM

## 2022-05-18 ENCOUNTER — Other Ambulatory Visit: Payer: Self-pay | Admitting: Family Medicine

## 2022-05-18 ENCOUNTER — Other Ambulatory Visit: Payer: Self-pay | Admitting: Unknown Physician Specialty

## 2022-05-18 DIAGNOSIS — I10 Essential (primary) hypertension: Secondary | ICD-10-CM

## 2022-05-18 NOTE — Telephone Encounter (Signed)
Called pt - [id number] for appointment. 

## 2022-05-18 NOTE — Telephone Encounter (Signed)
Courtesy refill. Patient will need an office visit for further refills. Requested Prescriptions  Pending Prescriptions Disp Refills  . amLODipine (NORVASC) 10 MG tablet [Pharmacy Med Name: amLODIPine Besylate 10 MG Oral Tablet] 30 tablet 0    Sig: Take 1 tablet by mouth once daily     Cardiovascular: Calcium Channel Blockers 2 Failed - 05/15/2022  1:07 PM      Failed - Valid encounter within last 6 months    Recent Outpatient Visits          6 months ago Essential hypertension   [place], DO   1 year ago New onset type 2 diabetes mellitus [facility])   [facility] Kathrine Haddock, NP   1 year ago Essential hypertension   [facility] Delsa Grana, PA-C   2 years ago Essential hypertension   [facility] Delsa Grana, PA-C   3 years ago Essential hypertension   [place], Astrid Divine, [place]      Future Appointments            In 5 months [place] BP in normal range    BP Readings from Last 1 Encounters:  11/17/21 113/67         Passed - Last Heart Rate in normal range    Pulse Readings from Last 1 Encounters:  11/17/21 85

## 2022-06-15 ENCOUNTER — Encounter: Payer: Self-pay | Admitting: Internal Medicine

## 2022-06-15 ENCOUNTER — Ambulatory Visit: Payer: Medicare Other | Admitting: Family Medicine

## 2022-06-15 ENCOUNTER — Ambulatory Visit (INDEPENDENT_AMBULATORY_CARE_PROVIDER_SITE_OTHER): Payer: Medicare Other | Admitting: Internal Medicine

## 2022-06-15 VITALS — BP 150/68 | HR 90 | Temp 98.2°F | Resp 16 | Ht 66.0 in | Wt 166.4 lb

## 2022-06-15 DIAGNOSIS — E782 Mixed hyperlipidemia: Secondary | ICD-10-CM

## 2022-06-15 DIAGNOSIS — I10 Essential (primary) hypertension: Secondary | ICD-10-CM | POA: Diagnosis not present

## 2022-06-15 DIAGNOSIS — E1165 Type 2 diabetes mellitus with hyperglycemia: Secondary | ICD-10-CM | POA: Diagnosis not present

## 2022-06-15 DIAGNOSIS — Z23 Encounter for immunization: Secondary | ICD-10-CM

## 2022-06-15 MED ORDER — AMLODIPINE BESYLATE 10 MG PO TABS: 10.0000 mg | ORAL_TABLET | Freq: Every day | ORAL | 1 refills | Status: DC

## 2022-06-15 MED ORDER — HYDRALAZINE HCL 25 MG PO TABS: 25.0000 mg | ORAL_TABLET | Freq: Three times a day (TID) | ORAL | 0 refills | Status: DC

## 2022-06-15 MED ORDER — VALSARTAN 160 MG PO TABS: 160.0000 mg | ORAL_TABLET | Freq: Every day | ORAL | 1 refills | Status: DC

## 2022-06-15 NOTE — Patient Instructions (Addendum)
It was great seeing you today!  Plan discussed at today's visit: -Medications refilled -Continue to work on diet -Return in 1 week for blood pressure check after taking medications -Pneumonia vaccine today -Plan to be fasting 8-12 hours next time for blood work  -Please remember to call to schedule eye exam and mammogram  Follow up in: 1 week for BP check (nurse's visit); 5 months  Take care and let us know if you have any questions or concerns prior to your next visit.  Dr. Rosana Berger

## 2022-06-15 NOTE — Progress Notes (Signed)
Established Patient Office Visit  Subjective:  Patient ID: Jeanne Hickman, female    DOB: [birth year]  Age: 76 y.o. MRN: [id number]  CC:  Chief Complaint  Patient presents with   Follow-up   Hyperlipidemia   Hypertension   Diabetes    HPI Jeanne Hickman presents for follow up on chronic medical conditions.   Pre-Diabetes: -Last A1c 6/23 6.1% -Eye exam: Follows with Ophtho within the last year, will call to schedule -Foot exam: Due today -Microalbumin: UTD elevated 1/23, on ARB -Statin: hx of intolerance  -PNA vaccine: yes -Denies symptoms of hypoglycemia, polyuria, polydipsia, numbness extremities, foot ulcers/trauma.   Hypertension: -Medications: Hydralazine 25 mg TID, Valsartan 160 mg, Amlodipine 10 mg - doesn't take medication until 9:30 -Patient is compliant with above medications and reports no side effects. -Checking BP at home (average): 130/70 -Denies any SOB, CP, vision changes, LE edema or symptoms of hypotension  HLD: -Medications: Zetia 10 mg, hx of statin intolerance secondary to severe myalgias  -Patient is compliant with above medications and reports no side effects.  -Last lipid panel: Lipid Panel     Component Value Date/Time   CHOL 199 10/24/2021 0919   TRIG 83 10/24/2021 0919   HDL 62 10/24/2021 0919   CHOLHDL 3.2 10/24/2021 0919   LDLCALC 119 (H) 10/24/2021 0919    Health Maintenance: -Blood work UTD -Mammogram/DEXA: ordered but not scheduled -Colonoscopy: 11/17/21 - does not need to repeat per GI  Past Medical History:  Diagnosis Date   Cataract    Hypertension     Past Surgical History:  Procedure Laterality Date   ABDOMINAL HYSTERECTOMY     COLONOSCOPY WITH PROPOFOL N/A 11/17/2021   Procedure: COLONOSCOPY WITH PROPOFOL;  Surgeon: Jonathon Bellows, MD;  Location: [facility] ENDOSCOPY;  Service: Gastroenterology;  Laterality: N/A;    History reviewed. No pertinent family history.  Social History   Socioeconomic History   Marital status:  Married    Spouse name: robert   Number of children: 2   Years of education: Not on file   Highest education level: Not on file  Occupational History   Occupation: retired  Tobacco Use   Smoking status: Former    Types: Cigarettes    Quit date: 02/28/1978    Years since quitting: 44.3   Smokeless tobacco: Never  Vaping Use   Vaping Use: Never used  Substance and Sexual Activity   Alcohol use: No   Drug use: No   Sexual activity: Yes    Partners: Male  Other Topics Concern   Not on file  Social History Narrative   Not on file   Social Determinants of Health   Financial Resource Strain: Low Risk  (10/14/2021)   Overall Financial Resource Strain (CARDIA)    Difficulty of Paying Living Expenses: Not hard at all  Food Insecurity: No Food Insecurity (10/14/2021)   Hunger Vital Sign    Worried About Running Out of Food in the Last Year: Never true    [place] in the Last Year: Never true  Transportation Needs: No Transportation Needs (10/14/2021)   PRAPARE - Hydrologist (Medical): No    Lack of Transportation (Non-Medical): No  Physical Activity: Insufficiently Active (10/14/2021)   Exercise Vital Sign    Days of Exercise per Week: 3 days    Minutes of Exercise per Session: 30 min  Stress: No Stress Concern Present (10/14/2021)   [place]  Questionnaire    Feeling of Stress : Only a little  Social Connections: Moderately Integrated (10/14/2021)   Social Connection and Isolation Panel [NHANES]    Frequency of Communication with Friends and Family: More than three times a week    Frequency of Social Gatherings with Friends and Family: More than three times a week    Attends Religious Services: More than 4 times per year    Active Member of [employer] or Organizations: No    Attends Archivist Meetings: Never    Marital Status: Married  Human resources officer Violence: Not At Risk  (10/14/2021)   Humiliation, Afraid, Rape, and Kick questionnaire    Fear of Current or Ex-Partner: No    Emotionally Abused: No    Physically Abused: No    Sexually Abused: No    Outpatient Medications Prior to Visit  Medication Sig Dispense Refill   amLODipine (NORVASC) 10 MG tablet Take 1 tablet by mouth once daily 30 tablet 0   ELDERBERRY PO Take by mouth. gummies     ezetimibe (ZETIA) 10 MG tablet Take 1 tablet by mouth once daily 90 tablet 1   hydrALAZINE (APRESOLINE) 25 MG tablet TAKE 1 TABLET BY MOUTH THREE TIMES DAILY 90 tablet 0   Multiple Vitamin (MULITIVITAMIN WITH MINERALS) TABS Take 1 tablet by mouth daily.     valsartan (DIOVAN) 160 MG tablet Take 1 tablet by mouth once daily 90 tablet 0   Sod Picosulfate-Mag Ox-Cit Acd (CLENPIQ) 10-3.5-12 MG-GM -GM/160ML SOLN Take 1 kit by mouth as directed. At 5 PM evening before procedure, drink 1 bottle of Clenpiq, hydrate, drink (5) 8 oz of water. Then do the same thing 5 hours prior to your procedure. (Patient not taking: Reported on 10/24/2021) 320 mL 0   No facility-administered medications prior to visit.    No Known Allergies  ROS Review of Systems  Constitutional:  Negative for chills and fever.  Eyes:  Negative for visual disturbance.  Respiratory:  Negative for cough and shortness of breath.   Cardiovascular:  Negative for chest pain and leg swelling.  Gastrointestinal:  Negative for abdominal pain, nausea and vomiting.  Neurological:  Negative for dizziness and headaches.      Objective:    Physical Exam Constitutional:      Appearance: Normal appearance.  HENT:     Head: Normocephalic and atraumatic.  Eyes:     Conjunctiva/sclera: Conjunctivae normal.  Cardiovascular:     Rate and Rhythm: Normal rate and regular rhythm.     Pulses:          Dorsalis pedis pulses are 2+ on the right side and 2+ on the left side.  Pulmonary:     Effort: Pulmonary effort is normal.     Breath sounds: Normal breath sounds.   Musculoskeletal:     Right lower leg: No edema.     Left lower leg: No edema.     Right foot: Normal range of motion. No deformity, bunion, Charcot foot, foot drop or prominent metatarsal heads.     Left foot: Normal range of motion. No deformity, bunion, Charcot foot, foot drop or prominent metatarsal heads.  Feet:     Right foot:     Protective Sensation: 6 sites tested.  6 sites sensed.     Skin integrity: Skin integrity normal.     Toenail Condition: Right toenails are normal.     Left foot:     Protective Sensation: 6 sites tested.  Skin integrity: Skin integrity normal.     Toenail Condition: Left toenails are normal.  Skin:    General: Skin is warm and dry.  Neurological:     General: No focal deficit present.     Mental Status: She is alert. Mental status is at baseline.  Psychiatric:        Mood and Affect: Mood normal.        Behavior: Behavior normal.     BP (!) 150/68   Pulse 90   Temp 98.2 F (36.8 C)   Resp 16   Ht '5\' 6"'  (1.676 m)   Wt 166 lb 6.4 oz (75.5 kg)   SpO2 96%   BMI 26.86 kg/m  Wt Readings from Last 3 Encounters:  06/15/22 166 lb 6.4 oz (75.5 kg)  11/17/21 168 lb (76.2 kg)  10/24/21 169 lb 11.2 oz (77 kg)     Health Maintenance Due  Topic Date Due   DEXA SCAN  Never done   FOOT EXAM  Never done   MAMMOGRAM  Never done   Zoster Vaccines- Shingrix (1 of 2) Never done   OPHTHALMOLOGY EXAM  07/25/2020   COVID-19 Vaccine (4 - [employer] series) 10/03/2020   INFLUENZA VACCINE  05/19/2022    There are no preventive care reminders to display for this patient.  Lab Results  Component Value Date   TSH 1.87 03/03/2019   Lab Results  Component Value Date   WBC 5.6 10/24/2021   HGB 12.9 10/24/2021   HCT 40.7 10/24/2021   MCV 84.6 10/24/2021   PLT 269 10/24/2021   Lab Results  Component Value Date   NA 145 10/24/2021   K 3.7 10/24/2021   CO2 29 10/24/2021   GLUCOSE 119 (H) 10/24/2021   BUN 11 10/24/2021   CREATININE 0.68  10/24/2021   BILITOT 0.4 10/24/2021   AST 16 10/24/2021   ALT 16 10/24/2021   PROT 7.4 10/24/2021   CALCIUM 9.3 10/24/2021   EGFR 91 10/24/2021   Lab Results  Component Value Date   CHOL 199 10/24/2021   Lab Results  Component Value Date   HDL 62 10/24/2021   Lab Results  Component Value Date   LDLCALC 119 (H) 10/24/2021   Lab Results  Component Value Date   TRIG 83 10/24/2021   Lab Results  Component Value Date   CHOLHDL 3.2 10/24/2021   Lab Results  Component Value Date   HGBA1C 6.1 04/02/2022      Assessment & Plan:   1. Essential hypertension: Blood pressure high today but patient hasn't taken any of her medication yet. She will follow up next week for a nurse's visit to recheck blood pressure after taking medications. BP at home at goal. Continue Hydralazine 25 mg TID, Amlodipine 10 mg, Valsartan 160 mg daily, refilled today.   - amLODipine (NORVASC) 10 MG tablet; Take 1 tablet (10 mg total) by mouth daily.  Dispense: 90 tablet; Refill: 1 - hydrALAZINE (APRESOLINE) 25 MG tablet; Take 1 tablet (25 mg total) by mouth 3 (three) times daily.  Dispense: 270 tablet; Refill: 0 - valsartan (DIOVAN) 160 MG tablet; Take 1 tablet (160 mg total) by mouth daily.  Dispense: 90 tablet; Refill: 1  2. Type 2 diabetes mellitus with hyperglycemia, without long-term current use of insulin ([place]): Controlled with diet. Plan to recheck A1c and microalbumin ratio at follow up. Foot exam today.   - HM Diabetes Foot Exam  3. Mixed hyperlipidemia: LDL slightly elevated, cannot tolerate statins. Continue  Zetia 10 mg daily.   4. Vaccine for streptococcus pneumoniae and influenza: Prevnar 20 administered today.   - Pneumococcal conjugate vaccine 20-valent (Prevnar 20)   Follow-up: Return in about 5 months (around 11/15/2022) for 1 week for nurse's visit for BP check, 5 months for follow up.    Teodora Medici, DO

## 2022-06-23 ENCOUNTER — Ambulatory Visit: Payer: Medicare Other

## 2022-06-23 VITALS — BP 138/70

## 2022-06-23 DIAGNOSIS — Z0130 Encounter for examination of blood pressure without abnormal findings: Secondary | ICD-10-CM

## 2022-07-10 LAB — HM MAMMOGRAPHY

## 2022-09-09 ENCOUNTER — Other Ambulatory Visit: Payer: Self-pay

## 2022-09-09 DIAGNOSIS — I10 Essential (primary) hypertension: Secondary | ICD-10-CM

## 2022-09-09 MED ORDER — HYDRALAZINE HCL 25 MG PO TABS: 25.0000 mg | ORAL_TABLET | Freq: Three times a day (TID) | ORAL | 0 refills | Status: DC

## 2022-10-10 ENCOUNTER — Other Ambulatory Visit: Payer: Self-pay | Admitting: Family Medicine

## 2022-10-10 DIAGNOSIS — E782 Mixed hyperlipidemia: Secondary | ICD-10-CM

## 2022-10-20 ENCOUNTER — Ambulatory Visit (INDEPENDENT_AMBULATORY_CARE_PROVIDER_SITE_OTHER): Payer: Medicare Other | Admitting: Physician Assistant

## 2022-10-20 ENCOUNTER — Encounter: Payer: Self-pay | Admitting: Physician Assistant

## 2022-10-20 VITALS — BP 144/69 | HR 69

## 2022-10-20 DIAGNOSIS — Z0000 Encounter for general adult medical examination without abnormal findings: Secondary | ICD-10-CM

## 2022-10-20 DIAGNOSIS — Z780 Asymptomatic menopausal state: Secondary | ICD-10-CM

## 2022-10-20 NOTE — Progress Notes (Signed)
Virtual Visit via Telephone Note  I connected with  Jeanne Hickman on 10/20/22 at  8:40 AM EST by telephone and verified that I am speaking with the correct person using two identifiers.  Location: Patient: at home  Provider: [place], [place]     I discussed the limitations, risks, security and privacy concerns of performing an evaluation and management service by telephone and the availability of in person appointments. The patient expressed understanding and agreed to proceed.  Interactive audio and video telecommunications were attempted between this nurse and patient, however failed, due to patient having technical difficulties OR patient did not have access to video capability.  We continued and completed visit with audio only.  Some vital signs may be absent or patient reported.    Subjective:   Jeanne Hickman is a 77 y.o. female who presents for Medicare Annual (Subsequent) preventive examination.  Today's Provider: Talitha Givens, MHS, PA-C Introduced myself to the patient as a PA-C and provided education on APPs in clinical practice.    Review of Systems:         Objective:     Vitals: BP (!) 144/69   Pulse 69   There is no height or weight on file to calculate BMI.     11/17/2021    7:24 AM 10/14/2021   12:20 PM 10/08/2020   10:13 AM 03/09/2019    3:46 PM  Advanced Directives  Does Patient Have a Medical Advance Directive? No Yes Yes No  Type of Corporate treasurer of [employer];Living will [place];Living will   Copy of [place] in Chart?  No - copy requested No - copy requested   Would patient like information on creating a medical advance directive?    Yes (MAU/Ambulatory/Procedural Areas - Information given)    Tobacco Social History   Tobacco Use  Smoking Status Former   Types: Cigarettes   Quit date: 02/28/1978   Years since quitting: 44.6  Smokeless Tobacco Never      Counseling given: Not Answered   Clinical Intake:  Pre-visit preparation completed: Yes  Pain : No/denies pain     Nutritional Status: BMI 25 -29 Overweight Nutritional Risks: None Diabetes: Yes CBG done?: No Did pt. bring in CBG monitor from home?: No  How often do you need to have someone help you when you read instructions, pamphlets, or other written materials from your doctor or pharmacy?: 1 - Never What is the last grade level you completed in school?: Masters Degree  Interpreter Needed?: No     Past Medical History:  Diagnosis Date   Cataract    Hypertension    Past Surgical History:  Procedure Laterality Date   ABDOMINAL HYSTERECTOMY     COLONOSCOPY WITH PROPOFOL N/A 11/17/2021   Procedure: COLONOSCOPY WITH PROPOFOL;  Surgeon: Jonathon Bellows, MD;  Location: [facility] ENDOSCOPY;  Service: Gastroenterology;  Laterality: N/A;   History reviewed. No pertinent family history. Social History   Socioeconomic History   Marital status: Married    Spouse name: robert   Number of children: 2   Years of education: Not on file   Highest education level: Not on file  Occupational History   Occupation: retired  Tobacco Use   Smoking status: Former    Types: Cigarettes    Quit date: 02/28/1978    Years since quitting: 44.6   Smokeless tobacco: Never  Vaping Use   Vaping Use: Never used  Substance and Sexual Activity   Alcohol use: No   Drug use: No   Sexual activity: Yes    Partners: Male  Other Topics Concern   Not on file  Social History Narrative   Not on file   Social Determinants of Health   Financial Resource Strain: Low Risk  (10/14/2021)   Overall Financial Resource Strain (CARDIA)    Difficulty of Paying Living Expenses: Not hard at all  Food Insecurity: No Food Insecurity (10/14/2021)   Hunger Vital Sign    Worried About Running Out of Food in the Last Year: Never true    Ran Out of Food in the Last Year: Never true  Transportation Needs: No  Transportation Needs (10/14/2021)   PRAPARE - Hydrologist (Medical): No    Lack of Transportation (Non-Medical): No  Physical Activity: Insufficiently Active (10/14/2021)   Exercise Vital Sign    Days of Exercise per Week: 3 days    Minutes of Exercise per Session: 30 min  Stress: No Stress Concern Present (10/14/2021)   [place]    Feeling of Stress : Only a little  Social Connections: Moderately Integrated (10/14/2021)   Social Connection and Isolation Panel [NHANES]    Frequency of Communication with Friends and Family: More than three times a week    Frequency of Social Gatherings with Friends and Family: More than three times a week    Attends Religious Services: More than 4 times per year    Active Member of [employer] or Organizations: No    Attends Archivist Meetings: Never    Marital Status: Married    Outpatient Encounter Medications as of 10/20/2022  Medication Sig   amLODipine (NORVASC) 10 MG tablet Take 1 tablet (10 mg total) by mouth daily.   ELDERBERRY PO Take by mouth. gummies   ezetimibe (ZETIA) 10 MG tablet Take 1 tablet by mouth once daily   hydrALAZINE (APRESOLINE) 25 MG tablet Take 1 tablet (25 mg total) by mouth 3 (three) times daily.   Multiple Vitamin (MULITIVITAMIN WITH MINERALS) TABS Take 1 tablet by mouth daily.   valsartan (DIOVAN) 160 MG tablet Take 1 tablet (160 mg total) by mouth daily.   No facility-administered encounter medications on file as of 10/20/2022.    Activities of Daily Living    10/20/2022    9:30 AM 10/20/2022    8:39 AM  In your present state of health, do you have any difficulty performing the following activities:  Hearing? 0 0  Vision? 0 0  Difficulty concentrating or making decisions? 0 0  Walking or climbing stairs? 0 0  Dressing or bathing? 0 0  Doing errands, shopping? 0 0  Preparing Food and eating ? N   Using the Toilet? N    In the past six months, have you accidently leaked urine? N   Do you have problems with loss of bowel control? N   Managing your Medications? N   Managing your Finances? N   Housekeeping or managing your Housekeeping? N     Patient Care Team: Delsa Grana, PA-C as PCP - General (Family Medicine)    Assessment:   This is a routine wellness examination for Jeanne Hickman.  Exercise Activities and Dietary recommendations Type of exercise: walking, Intensity: Mild   Goals Addressed   None     Fall Risk:    10/20/2022    8:39 AM 06/15/2022    8:29 AM  10/24/2021    8:28 AM 10/14/2021   12:21 PM 04/03/2021    7:58 AM  Fall Risk   Falls in the past year? 0 0 0 0 0  Number falls in past yr: 0 0 0 0 0  Injury with Fall? 0 0 0 0 0  Risk for fall due to : No Fall Risks  No Fall Risks No Fall Risks   Follow up Falls prevention discussed;Education provided;Falls evaluation completed  Falls prevention discussed Falls prevention discussed     FALL RISK PREVENTION PERTAINING TO THE HOME:  Any stairs in or around the home? Yes  If so, are there any without handrails? Yes   Home free of loose throw rugs in walkways, pet beds, electrical cords, etc? Yes  Adequate lighting in your home to reduce risk of falls? Yes   ASSISTIVE DEVICES UTILIZED TO PREVENT FALLS:  Life alert? No  Use of a cane, walker or w/c? No  Grab bars in the bathroom? Yes  Shower chair or bench in shower? No  Elevated toilet seat or a handicapped toilet? No   DME ORDERS:  DME order needed?  No   TIMED UP AND GO:  Was the test performed? No .  Length of time to ambulate Virtual visit     Depression Screen    10/20/2022    8:39 AM 06/15/2022    8:32 AM 10/24/2021    8:29 AM 10/14/2021   12:19 PM  PHQ 2/9 Scores  PHQ - 2 Score 0 0 0 0  PHQ- 9 Score 0 0 0      Cognitive Function        10/20/2022    9:34 AM 03/09/2019    3:51 PM  6CIT Screen  What Year? 0 points 0 points  What month? 0 points 0 points   What time? 0 points 0 points  Count back from 20 0 points 0 points  Months in reverse 0 points 0 points  Repeat phrase 2 points 0 points  Total Score 2 points 0 points    Immunization History  Administered Date(s) Administered   Fluad Quad(high Dose 65+) 10/24/2021   Influenza, High Dose Seasonal PF 08/08/2020   PFIZER(Purple Top)SARS-COV-2 Vaccination 11/15/2019, 12/06/2019, 08/08/2020, 07/09/2022   PNEUMOCOCCAL CONJUGATE-20 06/15/2022   Pneumococcal Conjugate-13 03/06/2019   Pneumococcal Polysaccharide-23 10/02/2020   Tdap 03/03/2019    Qualifies for Shingles Vaccine? Yes . Due for Shingrix. Education has been provided regarding the importance of this vaccine. Pt has been advised to call insurance company to determine out of pocket expense. Advised may also receive vaccine at local pharmacy or Health Dept. Verbalized acceptance and understanding.  Tdap: Completed 03/03/2019  Flu Vaccine: Due for Flu vaccine. Does the patient want to receive this vaccine today?  Yes . Education has been provided regarding the importance of this vaccine but still declined. Advised may receive this vaccine at local pharmacy or Health Dept. Aware to provide a copy of the vaccination record if obtained from local pharmacy or Health Dept. Verbalized acceptance and understanding.  Pneumococcal Vaccine: Completed 06/15/22  Covid-19 Vaccine:  Completed vaccines  Screening Tests Health Maintenance  Topic Date Due   DEXA SCAN  Never done   OPHTHALMOLOGY EXAM  07/25/2020   HEMOGLOBIN A1C  10/02/2022   Diabetic kidney evaluation - eGFR measurement  10/24/2022   Diabetic kidney evaluation - Urine ACR  10/24/2022   COVID-19 Vaccine (5 - 2023-24 season) 11/05/2022 (Originally 09/03/2022)   INFLUENZA VACCINE  01/17/2023 (  Originally 05/19/2022)   Zoster Vaccines- Shingrix (1 of 2) 01/19/2023 (Originally 04/26/1996)   FOOT EXAM  06/16/2023   MAMMOGRAM  07/11/2023   Medicare Annual Wellness (AWV)  10/21/2023    DTaP/Tdap/Td (2 - Td or Tdap) 03/02/2029   Pneumonia Vaccine 7+ Years old  Completed   Hepatitis C Screening  Completed   HPV VACCINES  Aged Out   COLONOSCOPY (Pts 45-66yr Insurance coverage will need to be confirmed)  Discontinued    Cancer Screenings:  Colorectal Screening: Completed 11/17/21.  No longer required.   Mammogram: Completed 07/10/22. Repeat every year  Bone Density:  Ordered 10/20/22. Pt provided with contact info and advised to call to schedule appt. Pt aware the office will call re: appt.  Lung Cancer Screening: (Low Dose CT Chest recommended if Age 77-80years, 30 pack-year currently smoking OR have quit w/in 15years.) does not qualify.   Lung Cancer Screening Referral: An Epic message has been sent to SBurgess Estelle RN (Oncology Nurse Navigator) regarding the possible need for this exam. SRaquel Sarnawill review the patient's chart to determine if the patient truly qualifies for the exam. If the patient qualifies, SRaquel Sarnawill order the Low Dose CT of the chest to facilitate the scheduling of this exam.  Additional Screening:  Hepatitis C Screening: does qualify; Completed 03/03/2019  Vision Screening: Recommended annual ophthalmology exams for early detection of glaucoma and other disorders of the eye. Is the patient up to date with their annual eye exam?  Yes  Who is the provider or what is the name of the office in which the pt attends annual eye exams? [facility]   Dental Screening: Recommended annual dental exams for proper oral hygiene  Community Resource Referral:  CRR required this visit?  No       Plan:  I have personally reviewed and addressed the Medicare Annual Wellness questionnaire and have noted the following in the patient's chart:  A. Medical and social history B. Use of alcohol, tobacco or illicit drugs  C. Current medications and supplements D. Functional ability and status E.  Nutritional status F.  Physical activity G. Advance  directives H. List of other physicians I.  Hospitalizations, surgeries, and ER visits in previous 12 months J.  VBenbowsuch as hearing and vision if needed, cognitive and depression L. Referrals and appointments   In addition, I have reviewed and discussed with patient certain preventive protocols, quality metrics, and best practice recommendations. A written personalized care plan for preventive services as well as general preventive health recommendations were provided to patient.  Signed,   ETalitha Givens MHS, PA-C [facility]

## 2022-10-20 NOTE — Patient Instructions (Addendum)
Jeanne Hickman , Thank you for taking time to come for your Medicare Wellness Visit. I appreciate your ongoing commitment to your health goals. Please review the following plan we discussed and let me know if I can assist you in the future.   Screening recommendations/referrals: Colonoscopy: Completed - no longer required  Mammogram: Completed 07/10/22- repeated every year  Bone Density: Over due - order placed today - Please call the [facility] to schedule this  Recommended yearly ophthalmology/optometry visit for glaucoma screening and checkup Recommended yearly dental visit for hygiene and checkup  Vaccinations: Influenza vaccine: Up to date Pneumococcal vaccine: Up to date and vaccine course completed  Tdap vaccine: next due in 2030 Shingles vaccine: Over due- please speak to your PCP regarding this vaccine    Covid-19:Please stay up to date on the vaccine and booster recommendations for your age group.   Advanced directives: Not on file. Information regarding completing advanced directives is attached to your AVS for you to review   Conditions/risks identified: None  Next appointment: Follow up in one year for your annual wellness visit    Preventive Care 65 Years and Older, Female Preventive care refers to lifestyle choices and visits with your health care provider that can promote health and wellness. What does preventive care include? A yearly physical exam. This is also called an annual well check. Dental exams once or twice a year. Routine eye exams. Ask your health care provider how often you should have your eyes checked. Personal lifestyle choices, including: Daily care of your teeth and gums. Regular physical activity. Eating a healthy diet. Avoiding tobacco and drug use. Limiting alcohol use. Practicing safe sex. Taking low-dose aspirin every day. Taking vitamin and mineral supplements as recommended by your health care provider. What happens during an  annual well check? The services and screenings done by your health care provider during your annual well check will depend on your age, overall health, lifestyle risk factors, and family history of disease. Counseling  Your health care provider may ask you questions about your: Alcohol use. Tobacco use. Drug use. Emotional well-being. Home and relationship well-being. Sexual activity. Eating habits. History of falls. Memory and ability to understand (cognition). Work and work Statistician. Reproductive health. Screening  You may have the following tests or measurements: Height, weight, and BMI. Blood pressure. Lipid and cholesterol levels. These may be checked every 5 years, or more frequently if you are over 70 years old. Skin check. Lung cancer screening. You may have this screening every year starting at age 52 if you have a 30-pack-year history of smoking and currently smoke or have quit within the past 15 years. Fecal occult blood test (FOBT) of the stool. You may have this test every year starting at age 34. Flexible sigmoidoscopy or colonoscopy. You may have a sigmoidoscopy every 5 years or a colonoscopy every 10 years starting at age 12. Hepatitis C blood test. Hepatitis B blood test. Sexually transmitted disease (STD) testing. Diabetes screening. This is done by checking your blood sugar (glucose) after you have not eaten for a while (fasting). You may have this done every 1-3 years. Bone density scan. This is done to screen for osteoporosis. You may have this done starting at age 28. Mammogram. This may be done every 1-2 years. Talk to your health care provider about how often you should have regular mammograms. Talk with your health care provider about your test results, treatment options, and if necessary, the need for more tests.  Vaccines  Your health care provider may recommend certain vaccines, such as: Influenza vaccine. This is recommended every year. Tetanus,  diphtheria, and acellular pertussis (Tdap, Td) vaccine. You may need a Td booster every 10 years. Zoster vaccine. You may need this after age 57. Pneumococcal 13-valent conjugate (PCV13) vaccine. One dose is recommended after age 36. Pneumococcal polysaccharide (PPSV23) vaccine. One dose is recommended after age 59. Talk to your health care provider about which screenings and vaccines you need and how often you need them. This information is not intended to replace advice given to you by your health care provider. Make sure you discuss any questions you have with your health care provider. Document Released: 11/01/2015 Document Revised: 06/24/2016 Document Reviewed: 08/06/2015 Elsevier Interactive Patient Education  2017 [place] Prevention in the Home Falls can cause injuries. They can happen to people of all ages. There are many things you can do to make your home safe and to help prevent falls. What can I do on the outside of my home? Regularly fix the edges of walkways and driveways and fix any cracks. Remove anything that might make you trip as you walk through a door, such as a raised step or threshold. Trim any bushes or trees on the path to your home. Use bright outdoor lighting. Clear any walking paths of anything that might make someone trip, such as rocks or tools. Regularly check to see if handrails are loose or broken. Make sure that both sides of any steps have handrails. Any raised decks and porches should have guardrails on the edges. Have any leaves, snow, or ice cleared regularly. Use sand or salt on walking paths during winter. Clean up any spills in your garage right away. This includes oil or grease spills. What can I do in the bathroom? Use night lights. Install grab bars by the toilet and in the tub and shower. Do not use towel bars as grab bars. Use non-skid mats or decals in the tub or shower. If you need to sit down in the shower, use a plastic,  non-slip stool. Keep the floor dry. Clean up any water that spills on the floor as soon as it happens. Remove soap buildup in the tub or shower regularly. Attach bath mats securely with double-sided non-slip rug tape. Do not have throw rugs and other things on the floor that can make you trip. What can I do in the bedroom? Use night lights. Make sure that you have a light by your bed that is easy to reach. Do not use any sheets or blankets that are too big for your bed. They should not hang down onto the floor. Have a firm chair that has side arms. You can use this for support while you get dressed. Do not have throw rugs and other things on the floor that can make you trip. What can I do in the kitchen? Clean up any spills right away. Avoid walking on wet floors. Keep items that you use a lot in easy-to-reach places. If you need to reach something above you, use a strong step stool that has a grab bar. Keep electrical cords out of the way. Do not use floor polish or wax that makes floors slippery. If you must use wax, use non-skid floor wax. Do not have throw rugs and other things on the floor that can make you trip. What can I do with my stairs? Do not leave any items on the stairs. Make sure that  there are handrails on both sides of the stairs and use them. Fix handrails that are broken or loose. Make sure that handrails are as long as the stairways. Check any carpeting to make sure that it is firmly attached to the stairs. Fix any carpet that is loose or worn. Avoid having throw rugs at the top or bottom of the stairs. If you do have throw rugs, attach them to the floor with carpet tape. Make sure that you have a light switch at the top of the stairs and the bottom of the stairs. If you do not have them, ask someone to add them for you. What else can I do to help prevent falls? Wear shoes that: Do not have high heels. Have rubber bottoms. Are comfortable and fit you well. Are closed  at the toe. Do not wear sandals. If you use a stepladder: Make sure that it is fully opened. Do not climb a closed stepladder. Make sure that both sides of the stepladder are locked into place. Ask someone to hold it for you, if possible. Clearly mark and make sure that you can see: Any grab bars or handrails. First and last steps. Where the edge of each step is. Use tools that help you move around (mobility aids) if they are needed. These include: Canes. Walkers. Scooters. Crutches. Turn on the lights when you go into a dark area. Replace any light bulbs as soon as they burn out. Set up your furniture so you have a clear path. Avoid moving your furniture around. If any of your floors are uneven, fix them. If there are any pets around you, be aware of where they are. Review your medicines with your doctor. Some medicines can make you feel dizzy. This can increase your chance of falling. Ask your doctor what other things that you can do to help prevent falls. This information is not intended to replace advice given to you by your health care provider. Make sure you discuss any questions you have with your health care provider. Document Released: 08/01/2009 Document Revised: 03/12/2016 Document Reviewed: 11/09/2014 Elsevier Interactive Patient Education  2017 [employer].

## 2022-11-13 NOTE — Progress Notes (Unsigned)
Established Patient Office Visit  Subjective:  Patient ID: Jeanne Hickman, female    DOB: 1946-01-29  Age: 77 y.o. MRN: [id number]  CC:  No chief complaint on file.   HPI [name] presents for follow up on chronic medical conditions.   Type 2 DM: -Last A1c 6/23 6.1% -Eye exam: Follows with Ophtho within the last year, will call to schedule -Foot exam: UTD 8/23 -Microalbumin: Due  -Statin: hx of intolerance  -PNA vaccine: yes -Denies symptoms of hypoglycemia, polyuria, polydipsia, numbness extremities, foot ulcers/trauma.   Hypertension: -Medications: Hydralazine 25 mg TID, Valsartan 160 mg, Amlodipine 10 mg - doesn't take medication until 9:30 -Patient is compliant with above medications and reports no side effects. -Checking BP at home (average): 130/70 -Denies any SOB, CP, vision changes, LE edema or symptoms of hypotension  HLD: -Medications: Zetia 10 mg, hx of statin intolerance secondary to severe myalgias  -Patient is compliant with above medications and reports no side effects.  -Last lipid panel: Lipid Panel     Component Value Date/Time   CHOL 199 10/24/2021 0919   TRIG 83 10/24/2021 0919   HDL 62 10/24/2021 0919   CHOLHDL 3.2 10/24/2021 0919   LDLCALC 119 (H) 10/24/2021 0919    Health Maintenance: -Blood work due -Mammogram: 9/23 normal  -Colonoscopy: 11/17/21 - does not need to repeat per GI  Past Medical History:  Diagnosis Date   Cataract    Hypertension     Past Surgical History:  Procedure Laterality Date   ABDOMINAL HYSTERECTOMY     COLONOSCOPY WITH PROPOFOL N/A 11/17/2021   Procedure: COLONOSCOPY WITH PROPOFOL;  Surgeon: Jonathon Bellows, MD;  Location: [facility] ENDOSCOPY;  Service: Gastroenterology;  Laterality: N/A;    No family history on file.  Social History   Socioeconomic History   Marital status: Married    Spouse name: robert   Number of children: 2   Years of education: Not on file   Highest education level: Not on file   Occupational History   Occupation: retired  Tobacco Use   Smoking status: Former    Types: Cigarettes    Quit date: 02/28/1978    Years since quitting: 44.7   Smokeless tobacco: Never  Vaping Use   Vaping Use: Never used  Substance and Sexual Activity   Alcohol use: No   Drug use: No   Sexual activity: Yes    Partners: Male  Other Topics Concern   Not on file  Social History Narrative   Not on file   Social Determinants of Health   Financial Resource Strain: Low Risk  (10/14/2021)   Overall Financial Resource Strain (CARDIA)    Difficulty of Paying Living Expenses: Not hard at all  Food Insecurity: No Food Insecurity (10/14/2021)   Hunger Vital Sign    Worried About Running Out of Food in the Last Year: Never true    [place] in the Last Year: Never true  Transportation Needs: No Transportation Needs (10/14/2021)   PRAPARE - Hydrologist (Medical): No    Lack of Transportation (Non-Medical): No  Physical Activity: Insufficiently Active (10/14/2021)   Exercise Vital Sign    Days of Exercise per Week: 3 days    Minutes of Exercise per Session: 30 min  Stress: No Stress Concern Present (10/14/2021)   [place]    Feeling of Stress : Only a little  Social Connections: Moderately Integrated (10/14/2021)  Social Licensed conveyancer [NHANES]    Frequency of Communication with Friends and Family: More than three times a week    Frequency of Social Gatherings with Friends and Family: More than three times a week    Attends Religious Services: More than 4 times per year    Active Member of [employer] or Organizations: No    Attends Archivist Meetings: Never    Marital Status: Married  Human resources officer Violence: Not At Risk (10/14/2021)   Humiliation, Afraid, Rape, and Kick questionnaire    Fear of Current or Ex-Partner: No    Emotionally Abused: No     Physically Abused: No    Sexually Abused: No    Outpatient Medications Prior to Visit  Medication Sig Dispense Refill   amLODipine (NORVASC) 10 MG tablet Take 1 tablet (10 mg total) by mouth daily. 90 tablet 1   ELDERBERRY PO Take by mouth. gummies     ezetimibe (ZETIA) 10 MG tablet Take 1 tablet by mouth once daily 90 tablet 0   hydrALAZINE (APRESOLINE) 25 MG tablet Take 1 tablet (25 mg total) by mouth 3 (three) times daily. 270 tablet 0   Multiple Vitamin (MULITIVITAMIN WITH MINERALS) TABS Take 1 tablet by mouth daily.     valsartan (DIOVAN) 160 MG tablet Take 1 tablet (160 mg total) by mouth daily. 90 tablet 1   No facility-administered medications prior to visit.    No Known Allergies  ROS Review of Systems  Constitutional:  Negative for chills and fever.  Eyes:  Negative for visual disturbance.  Respiratory:  Negative for cough and shortness of breath.   Cardiovascular:  Negative for chest pain and leg swelling.  Gastrointestinal:  Negative for abdominal pain, nausea and vomiting.  Neurological:  Negative for dizziness and headaches.      Objective:    Physical Exam Constitutional:      Appearance: Normal appearance.  HENT:     Head: Normocephalic and atraumatic.  Eyes:     Conjunctiva/sclera: Conjunctivae normal.  Cardiovascular:     Rate and Rhythm: Normal rate and regular rhythm.     Pulses:          Dorsalis pedis pulses are 2+ on the right side and 2+ on the left side.  Pulmonary:     Effort: Pulmonary effort is normal.     Breath sounds: Normal breath sounds.  Musculoskeletal:     Right lower leg: No edema.     Left lower leg: No edema.     Right foot: Normal range of motion. No deformity, bunion, Charcot foot, foot drop or prominent metatarsal heads.     Left foot: Normal range of motion. No deformity, bunion, Charcot foot, foot drop or prominent metatarsal heads.  Feet:     Right foot:     Protective Sensation: 6 sites tested.  6 sites sensed.      Skin integrity: Skin integrity normal.     Toenail Condition: Right toenails are normal.     Left foot:     Protective Sensation: 6 sites tested.       Skin integrity: Skin integrity normal.     Toenail Condition: Left toenails are normal.  Skin:    General: Skin is warm and dry.  Neurological:     General: No focal deficit present.     Mental Status: She is alert. Mental status is at baseline.  Psychiatric:        Mood and Affect: Mood normal.  Behavior: Behavior normal.     There were no vitals taken for this visit. Wt Readings from Last 3 Encounters:  06/15/22 166 lb 6.4 oz (75.5 kg)  11/17/21 168 lb (76.2 kg)  10/24/21 169 lb 11.2 oz (77 kg)     Health Maintenance Due  Topic Date Due   DEXA SCAN  Never done   OPHTHALMOLOGY EXAM  07/25/2020   COVID-19 Vaccine (5 - 2023-24 season) 09/03/2022   HEMOGLOBIN A1C  10/02/2022   Diabetic kidney evaluation - eGFR measurement  10/24/2022   Diabetic kidney evaluation - Urine ACR  10/24/2022    There are no preventive care reminders to display for this patient.  Lab Results  Component Value Date   TSH 1.87 03/03/2019   Lab Results  Component Value Date   WBC 5.6 10/24/2021   HGB 12.9 10/24/2021   HCT 40.7 10/24/2021   MCV 84.6 10/24/2021   PLT 269 10/24/2021   Lab Results  Component Value Date   NA 145 10/24/2021   K 3.7 10/24/2021   CO2 29 10/24/2021   GLUCOSE 119 (H) 10/24/2021   BUN 11 10/24/2021   CREATININE 0.68 10/24/2021   BILITOT 0.4 10/24/2021   AST 16 10/24/2021   ALT 16 10/24/2021   PROT 7.4 10/24/2021   CALCIUM 9.3 10/24/2021   EGFR 91 10/24/2021   Lab Results  Component Value Date   CHOL 199 10/24/2021   Lab Results  Component Value Date   HDL 62 10/24/2021   Lab Results  Component Value Date   LDLCALC 119 (H) 10/24/2021   Lab Results  Component Value Date   TRIG 83 10/24/2021   Lab Results  Component Value Date   CHOLHDL 3.2 10/24/2021   Lab Results  Component Value Date    HGBA1C 6.1 04/02/2022      Assessment & Plan:   1. Essential hypertension: Blood pressure high today but patient hasn't taken any of her medication yet. She will follow up next week for a nurse's visit to recheck blood pressure after taking medications. BP at home at goal. Continue Hydralazine 25 mg TID, Amlodipine 10 mg, Valsartan 160 mg daily, refilled today.   - amLODipine (NORVASC) 10 MG tablet; Take 1 tablet (10 mg total) by mouth daily.  Dispense: 90 tablet; Refill: 1 - hydrALAZINE (APRESOLINE) 25 MG tablet; Take 1 tablet (25 mg total) by mouth 3 (three) times daily.  Dispense: 270 tablet; Refill: 0 - valsartan (DIOVAN) 160 MG tablet; Take 1 tablet (160 mg total) by mouth daily.  Dispense: 90 tablet; Refill: 1  2. Type 2 diabetes mellitus with hyperglycemia, without long-term current use of insulin ([place]): Controlled with diet. Plan to recheck A1c and microalbumin ratio at follow up. Foot exam today.   - HM Diabetes Foot Exam  3. Mixed hyperlipidemia: LDL slightly elevated, cannot tolerate statins. Continue Zetia 10 mg daily.   4. Vaccine for streptococcus pneumoniae and influenza: Prevnar 20 administered today.   - Pneumococcal conjugate vaccine 20-valent (Prevnar 20)   Follow-up: No follow-ups on file.    Teodora Medici, DO

## 2022-11-16 ENCOUNTER — Encounter: Payer: Self-pay | Admitting: Internal Medicine

## 2022-11-16 ENCOUNTER — Ambulatory Visit (INDEPENDENT_AMBULATORY_CARE_PROVIDER_SITE_OTHER): Payer: Medicare Other | Admitting: Internal Medicine

## 2022-11-16 VITALS — BP 144/68 | HR 106 | Temp 98.0°F | Resp 18 | Ht 66.0 in | Wt 166.9 lb

## 2022-11-16 DIAGNOSIS — I10 Essential (primary) hypertension: Secondary | ICD-10-CM

## 2022-11-16 DIAGNOSIS — E1165 Type 2 diabetes mellitus with hyperglycemia: Secondary | ICD-10-CM | POA: Diagnosis not present

## 2022-11-16 DIAGNOSIS — E782 Mixed hyperlipidemia: Secondary | ICD-10-CM | POA: Diagnosis not present

## 2022-11-16 MED ORDER — HYDRALAZINE HCL 25 MG PO TABS: 25.0000 mg | ORAL_TABLET | Freq: Three times a day (TID) | ORAL | 0 refills | Status: DC

## 2022-11-16 MED ORDER — VALSARTAN 160 MG PO TABS: 160.0000 mg | ORAL_TABLET | Freq: Every day | ORAL | 1 refills | Status: DC

## 2022-11-16 MED ORDER — EZETIMIBE 10 MG PO TABS: 10.0000 mg | ORAL_TABLET | Freq: Every day | ORAL | 1 refills | Status: DC

## 2022-11-16 MED ORDER — AMLODIPINE BESYLATE 10 MG PO TABS: 10.0000 mg | ORAL_TABLET | Freq: Every day | ORAL | 1 refills | Status: DC

## 2022-11-17 LAB — COMPLETE METABOLIC PANEL WITH GFR
AG Ratio: 1.4 (calc) (ref 1.0–2.5)
ALT: 14 U/L (ref 6–29)
AST: 18 U/L (ref 10–35)
Albumin: 4.3 g/dL (ref 3.6–5.1)
Alkaline phosphatase (APISO): 70 U/L (ref 37–153)
BUN: 12 mg/dL (ref 7–25)
CO2: 29 mmol/L (ref 20–32)
Calcium: 9.3 mg/dL (ref 8.6–10.4)
Chloride: 105 mmol/L (ref 98–110)
Creat: 0.67 mg/dL (ref 0.60–1.00)
Globulin: 3 g/dL (calc) (ref 1.9–3.7)
Glucose, Bld: 115 mg/dL — ABNORMAL HIGH (ref 65–99)
Potassium: 3.7 mmol/L (ref 3.5–5.3)
Sodium: 141 mmol/L (ref 135–146)
Total Bilirubin: 0.7 mg/dL (ref 0.2–1.2)
Total Protein: 7.3 g/dL (ref 6.1–8.1)
eGFR: 91 mL/min/{1.73_m2} (ref >=60)

## 2022-11-17 LAB — MICROALBUMIN / CREATININE URINE RATIO
Creatinine, Urine: 95 mg/dL (ref 20–275)
Microalb Creat Ratio: 179 mcg/mg creat — ABNORMAL HIGH (ref <30)
Microalb, Ur: 17 mg/dL

## 2022-11-17 LAB — LIPID PANEL
Cholesterol: 199 mg/dL (ref <200)
HDL: 62 mg/dL (ref >=50)
LDL Cholesterol (Calc): 118 mg/dL (calc) — ABNORMAL HIGH
Non-HDL Cholesterol (Calc): 137 mg/dL (calc) — ABNORMAL HIGH (ref <130)
Total CHOL/HDL Ratio: 3.2 (calc) (ref <5.0)
Triglycerides: 90 mg/dL (ref <150)

## 2022-11-17 LAB — CBC WITH DIFFERENTIAL/PLATELET
Absolute Monocytes: 481 cells/uL (ref 200–950)
Basophils Absolute: 41 cells/uL (ref 0–200)
Basophils Relative: 0.7 %
Eosinophils Absolute: 81 cells/uL (ref 15–500)
Eosinophils Relative: 1.4 %
HCT: 38.8 % (ref 35.0–45.0)
Hemoglobin: 12.3 g/dL (ref 11.7–15.5)
Lymphs Abs: 1717 cells/uL (ref 850–3900)
MCH: 26.3 pg — ABNORMAL LOW (ref 27.0–33.0)
MCHC: 31.7 g/dL — ABNORMAL LOW (ref 32.0–36.0)
MCV: 83.1 fL (ref 80.0–100.0)
MPV: 11.4 fL (ref 7.5–12.5)
Monocytes Relative: 8.3 %
Neutro Abs: 3480 cells/uL (ref 1500–7800)
Neutrophils Relative %: 60 %
Platelets: 274 10*3/uL (ref 140–400)
RBC: 4.67 10*6/uL (ref 3.80–5.10)
RDW: 13.9 % (ref 11.0–15.0)
Total Lymphocyte: 29.6 %
WBC: 5.8 10*3/uL (ref 3.8–10.8)

## 2022-11-17 LAB — HEMOGLOBIN A1C
Hgb A1c MFr Bld: 6.5 % of total Hgb — ABNORMAL HIGH (ref <5.7)
Mean Plasma Glucose: 140 mg/dL
eAG (mmol/L): 7.7 mmol/L

## 2022-11-30 DIAGNOSIS — Z961 Presence of intraocular lens: Secondary | ICD-10-CM | POA: Diagnosis not present

## 2022-11-30 DIAGNOSIS — H35033 Hypertensive retinopathy, bilateral: Secondary | ICD-10-CM | POA: Diagnosis not present

## 2022-11-30 DIAGNOSIS — H26492 Other secondary cataract, left eye: Secondary | ICD-10-CM | POA: Diagnosis not present

## 2022-11-30 LAB — HM DIABETES EYE EXAM

## 2023-01-25 ENCOUNTER — Ambulatory Visit
Admission: RE | Admit: 2023-01-25 | Discharge: 2023-01-25 | Disposition: A | Discharge status: Home / Self Care | Payer: Medicare Other | Source: Ambulatory Visit | Attending: Physician Assistant | Admitting: Physician Assistant

## 2023-01-25 DIAGNOSIS — Z780 Asymptomatic menopausal state: Secondary | ICD-10-CM | POA: Insufficient documentation

## 2023-01-25 DIAGNOSIS — M8589 Other specified disorders of bone density and structure, multiple sites: Secondary | ICD-10-CM | POA: Diagnosis not present

## 2023-01-25 NOTE — Progress Notes (Signed)
The results of your bone mineral testing revealed that you are osteopenic. This means that your bones are not as strong as they used to be and can lead to osteoporosis and and increased risk of fractures I recommend supplementation with Calcium and Vitamin D to assist with preventing progression to osteoporosis and to help improve bone health. You should strive to get 1000-1200 mg of Calcium per day (supplementing and Calcium found in your regular diet should total this amount per day) You should try to get [phone] IU of Vitamin D per day (supplementing with Vitamin D2 or Vitamin D3 in addition to dietary sources should total this amount per day)  Weight bearing exercises and strength training can also help improve bone health.

## 2023-03-03 ENCOUNTER — Other Ambulatory Visit: Payer: Self-pay | Admitting: Internal Medicine

## 2023-03-03 DIAGNOSIS — I10 Essential (primary) hypertension: Secondary | ICD-10-CM

## 2023-03-03 NOTE — Telephone Encounter (Signed)
Requested medication (s) are due for refill today: expired medication  Requested medication (s) are on the active medication list: yes  Last refill:  11/16/22-02/14/23 #270 0 refills  Future visit scheduled: yes in 2 months   Notes to clinic:  expired medication . Do you want to renew Rx?     Requested Prescriptions  Pending Prescriptions Disp Refills   hydrALAZINE (APRESOLINE) 25 MG tablet [Pharmacy Med Name: hydrALAZINE HCl 25 MG Oral Tablet] 270 tablet 0    Sig: TAKE 1 TABLET BY MOUTH THREE TIMES DAILY     Cardiovascular:  Vasodilators Failed - 03/03/2023  6:50 AM      Failed - ANA Screen, Ifa, Serum in normal range and within 360 days    No results found for: "ANA", "ANATITER", "LABANTI"       Failed - Last BP in normal range    BP Readings from Last 1 Encounters:  11/16/22 (!) 144/68         Passed - HCT in normal range and within 360 days    HCT  Date Value Ref Range Status  11/16/2022 38.8 35.0 - 45.0 % Final         Passed - HGB in normal range and within 360 days    Hemoglobin  Date Value Ref Range Status  11/16/2022 12.3 11.7 - 15.5 g/dL Final         Passed - RBC in normal range and within 360 days    RBC  Date Value Ref Range Status  11/16/2022 4.67 3.80 - 5.10 Million/uL Final         Passed - WBC in normal range and within 360 days    WBC  Date Value Ref Range Status  11/16/2022 5.8 3.8 - 10.8 Thousand/uL Final         Passed - PLT in normal range and within 360 days    Platelets  Date Value Ref Range Status  11/16/2022 274 140 - 400 Thousand/uL Final         Passed - Valid encounter within last 12 months    Recent Outpatient Visits           3 months ago Essential hypertension   [facility] Margarita Mail, DO   4 months ago Encounter for [employer] annual wellness exam   [facility] Mecum, Oswaldo Conroy, PA-C   8 months ago Essential hypertension   [facility]  Margarita Mail, DO   1 year ago Essential hypertension   [facility] Margarita Mail, DO   1 year ago New onset type 2 diabetes mellitus [facility])   [facility] Gabriel Cirri, NP       Future Appointments             In 2 months Danelle Berry, PA-C [facility], [facility]

## 2023-03-04 ENCOUNTER — Other Ambulatory Visit: Payer: Self-pay | Admitting: Internal Medicine

## 2023-03-04 DIAGNOSIS — I10 Essential (primary) hypertension: Secondary | ICD-10-CM

## 2023-03-04 MED ORDER — HYDRALAZINE HCL 25 MG PO TABS
25.0000 mg | ORAL_TABLET | Freq: Three times a day (TID) | ORAL | 0 refills | Status: DC
Start: 2023-03-04 — End: 2023-05-18

## 2023-05-18 ENCOUNTER — Ambulatory Visit (INDEPENDENT_AMBULATORY_CARE_PROVIDER_SITE_OTHER): Payer: Medicare Other | Admitting: Family Medicine

## 2023-05-18 ENCOUNTER — Encounter: Payer: Self-pay | Admitting: Family Medicine

## 2023-05-18 VITALS — BP 136/72 | HR 93 | Resp 16 | Ht 66.0 in | Wt 167.0 lb

## 2023-05-18 DIAGNOSIS — E782 Mixed hyperlipidemia: Secondary | ICD-10-CM

## 2023-05-18 DIAGNOSIS — R809 Proteinuria, unspecified: Secondary | ICD-10-CM

## 2023-05-18 DIAGNOSIS — E1169 Type 2 diabetes mellitus with other specified complication: Secondary | ICD-10-CM

## 2023-05-18 DIAGNOSIS — T466X5A Adverse effect of antihyperlipidemic and antiarteriosclerotic drugs, initial encounter: Secondary | ICD-10-CM

## 2023-05-18 DIAGNOSIS — G720 Drug-induced myopathy: Secondary | ICD-10-CM | POA: Diagnosis not present

## 2023-05-18 DIAGNOSIS — I10 Essential (primary) hypertension: Secondary | ICD-10-CM | POA: Diagnosis not present

## 2023-05-18 DIAGNOSIS — E1129 Type 2 diabetes mellitus with other diabetic kidney complication: Secondary | ICD-10-CM | POA: Diagnosis not present

## 2023-05-18 DIAGNOSIS — Z780 Asymptomatic menopausal state: Secondary | ICD-10-CM

## 2023-05-18 DIAGNOSIS — Z7984 Long term (current) use of oral hypoglycemic drugs: Secondary | ICD-10-CM

## 2023-05-18 DIAGNOSIS — E785 Hyperlipidemia, unspecified: Secondary | ICD-10-CM

## 2023-05-18 DIAGNOSIS — E1165 Type 2 diabetes mellitus with hyperglycemia: Secondary | ICD-10-CM

## 2023-05-18 DIAGNOSIS — M8580 Other specified disorders of bone density and structure, unspecified site: Secondary | ICD-10-CM

## 2023-05-18 LAB — POCT GLYCOSYLATED HEMOGLOBIN (HGB A1C): Hemoglobin A1C: 6.3 % — AB (ref 4.0–5.6)

## 2023-05-18 MED ORDER — AMLODIPINE BESYLATE 10 MG PO TABS: 10.0000 mg | ORAL_TABLET | Freq: Every day | ORAL | 1 refills | Status: DC

## 2023-05-18 MED ORDER — EZETIMIBE 10 MG PO TABS: 10.0000 mg | ORAL_TABLET | Freq: Every day | ORAL | 1 refills | Status: DC

## 2023-05-18 MED ORDER — VALSARTAN 160 MG PO TABS: 160.0000 mg | ORAL_TABLET | Freq: Every day | ORAL | 1 refills | Status: DC

## 2023-05-18 MED ORDER — DAPAGLIFLOZIN PROPANEDIOL 10 MG PO TABS
10.0000 mg | ORAL_TABLET | Freq: Every day | ORAL | 1 refills | Status: DC
Start: 2023-05-18 — End: 2023-11-10

## 2023-05-18 MED ORDER — HYDRALAZINE HCL 25 MG PO TABS: 25.0000 mg | ORAL_TABLET | Freq: Three times a day (TID) | ORAL | 0 refills | Status: DC

## 2023-05-18 NOTE — Patient Instructions (Signed)
Shingrix vaccine

## 2023-05-18 NOTE — Progress Notes (Signed)
Name: Jeanne Hickman   MRN: [id number]    DOB: Sep 05, 1946   Date:05/18/2023       Progress Note  Subjective  Chief Complaint  Chief Complaint  Patient presents with   Follow-up    HPI  DMII: she is on diet only , she also walks most days of the week a total of 3 miles. She has associated HTN, dyslipidemia and microalbuminuria. She is taking Zetia ( due to statin myopathy), ARB for microalbuminuria and HTN and denies side effects. She denies polyphagia, polydipsia or polyuria . A1C was 6.3 % today   HTN: BP is at goal for her,she saw nephrologist years ago and has been on current regiment for a long time. No side effects. No chest pain, palpitation , denies orthostatic hypotension   Dyslipidemia: only taking Zetia, LDL not at goal, discussed switching Nexlizet to get LDL below 70 but she wants to hold off for now, worried about cost  Osteopenia: post menopausal with low FRAX, continue vitamin D and high calcium diet    Patient Active Problem List   Diagnosis Date Noted   New onset type 2 diabetes mellitus (HCC) 10/08/2020   Chronic kidney disease (CKD) 04/04/2020   Essential hypertension 03/17/2019   Mixed hyperlipidemia 03/17/2019   Proteinuria 03/17/2019   Cataract     Past Surgical History:  Procedure Laterality Date   ABDOMINAL HYSTERECTOMY     COLONOSCOPY WITH PROPOFOL N/A 11/17/2021   Procedure: COLONOSCOPY WITH PROPOFOL;  Surgeon: Wyline Mood, MD;  Location: [facility] ENDOSCOPY;  Service: Gastroenterology;  Laterality: N/A;    No family history on file.  Social History   Tobacco Use   Smoking status: Former    Current packs/day: 0.00    Types: Cigarettes    Quit date: 02/28/1978    Years since quitting: 45.2   Smokeless tobacco: Never  Substance Use Topics   Alcohol use: No     Current Outpatient Medications:    amLODipine (NORVASC) 10 MG tablet, Take 1 tablet (10 mg total) by mouth daily., Disp: 90 tablet, Rfl: 1   ELDERBERRY PO, Take by mouth. gummies, Disp: ,  Rfl:    ezetimibe (ZETIA) 10 MG tablet, Take 1 tablet (10 mg total) by mouth daily., Disp: 90 tablet, Rfl: 1   hydrALAZINE (APRESOLINE) 25 MG tablet, Take 1 tablet (25 mg total) by mouth 3 (three) times daily., Disp: 270 tablet, Rfl: 0   Multiple Vitamin (MULITIVITAMIN WITH MINERALS) TABS, Take 1 tablet by mouth daily., Disp: , Rfl:    valsartan (DIOVAN) 160 MG tablet, Take 1 tablet (160 mg total) by mouth daily., Disp: 90 tablet, Rfl: 1  No Known Allergies  I personally reviewed active problem list, medication list, allergies, family history, social history, health maintenance with the patient/caregiver today.   ROS  Constitutional: Negative for fever or weight change.  Respiratory: Negative for cough and shortness of breath.   Cardiovascular: Negative for chest pain or palpitations.  Gastrointestinal: Negative for abdominal pain, no bowel changes.  Musculoskeletal: Negative for gait problem or joint swelling.  Skin: Negative for rash.  Neurological: Negative for dizziness or headache.  No other specific complaints in a complete review of systems (except as listed in HPI above).   Objective  Vitals:   05/18/23 0759  BP: 136/72  Pulse: 93  Resp: 16  SpO2: 95%  Weight: 167 lb (75.8 kg)  Height: 5\' 6"  (1.676 m)    Body mass index is 26.95 kg/m.  Physical Exam  Constitutional:  Patient appears well-developed and well-nourished.  No distress.  HEENT: head atraumatic, normocephalic, pupils equal and reactive to light, neck supple Cardiovascular: Normal rate, regular rhythm and normal heart sounds.  No murmur heard. No BLE edema. Pulmonary/Chest: Effort normal and breath sounds normal. No respiratory distress. Abdominal: Soft.  There is no tenderness. Psychiatric: Patient has a normal mood and affect. behavior is normal. Judgment and thought content normal.    PHQ2/9:    05/18/2023    7:59 AM 11/16/2022    8:24 AM 10/20/2022    8:39 AM 06/15/2022    8:32 AM 10/24/2021    8:29  AM  Depression screen PHQ 2/9  Decreased Interest 0 0 0 0 0  Down, Depressed, Hopeless 0 0 0 0 0  PHQ - 2 Score 0 0 0 0 0  Altered sleeping 0 0 0 0 0  Tired, decreased energy 0 0 0 0 0  Change in appetite 0 0 0 0 0  Feeling bad or failure about yourself  0 0 0 0 0  Trouble concentrating 0 0 0 0 0  Moving slowly or fidgety/restless 0 0 0 0 0  Suicidal thoughts 0 0 0 0 0  PHQ-9 Score 0 0 0 0 0  Difficult doing work/chores  Not difficult at all Not difficult at all Not difficult at all Not difficult at all    phq 9 is negative   Fall Risk:    05/18/2023    7:59 AM 11/16/2022    8:20 AM 10/20/2022    8:39 AM 06/15/2022    8:29 AM 10/24/2021    8:28 AM  Fall Risk   Falls in the past year? 0 0 0 0 0  Number falls in past yr: 0 0 0 0 0  Injury with Fall? 0 0 0 0 0  Risk for fall due to : No Fall Risks  No Fall Risks  No Fall Risks  Follow up Falls prevention discussed  Falls prevention discussed;Education provided;Falls evaluation completed  Falls prevention discussed     Functional Status Survey: Is the patient deaf or have difficulty hearing?: No Does the patient have difficulty seeing, even when wearing glasses/contacts?: No Does the patient have difficulty concentrating, remembering, or making decisions?: No Does the patient have difficulty walking or climbing stairs?: No Does the patient have difficulty dressing or bathing?: No Does the patient have difficulty doing errands alone such as visiting a doctor's office or shopping?: No    Assessment & Plan   1. Dyslipidemia associated with type 2 diabetes mellitus (HCC)  - POCT HgB A1C - ezetimibe (ZETIA) 10 MG tablet; Take 1 tablet (10 mg total) by mouth daily.  Dispense: 90 tablet; Refill: 1  2. Diabetes mellitus with microalbuminuria (HCC)  - dapagliflozin propanediol (FARXIGA) 10 MG TABS tablet; Take 1 tablet (10 mg total) by mouth daily before breakfast.  Dispense: 90 tablet; Refill: 1 - valsartan (DIOVAN) 160 MG  tablet; Take 1 tablet (160 mg total) by mouth daily.  Dispense: 90 tablet; Refill: 1  3. Postmenopausal estrogen deficiency  Stable  4. Osteopenia after menopause  Discussed high calcium diet and continue vitamin D supplementation  5. Essential hypertension  - hydrALAZINE (APRESOLINE) 25 MG tablet; Take 1 tablet (25 mg total) by mouth 3 (three) times daily.  Dispense: 270 tablet; Refill: 0 - amLODipine (NORVASC) 10 MG tablet; Take 1 tablet (10 mg total) by mouth daily.  Dispense: 90 tablet; Refill: 1 - valsartan (DIOVAN) 160 MG tablet; Take 1  tablet (160 mg total) by mouth daily.  Dispense: 90 tablet; Refill: 1  6. Statin myopathy  - ezetimibe (ZETIA) 10 MG tablet; Take 1 tablet (10 mg total) by mouth daily.  Dispense: 90 tablet; Refill: 1  7. Mixed hyperlipidemia  - ezetimibe (ZETIA) 10 MG tablet; Take 1 tablet (10 mg total) by mouth daily.  Dispense: 90 tablet; Refill: 1

## 2023-08-29 ENCOUNTER — Other Ambulatory Visit: Payer: Self-pay | Admitting: Family Medicine

## 2023-08-29 DIAGNOSIS — I10 Essential (primary) hypertension: Secondary | ICD-10-CM

## 2023-10-08 ENCOUNTER — Other Ambulatory Visit: Payer: Self-pay | Admitting: Family Medicine

## 2023-10-08 DIAGNOSIS — I10 Essential (primary) hypertension: Secondary | ICD-10-CM

## 2023-10-08 MED ORDER — HYDRALAZINE HCL 25 MG PO TABS
25.0000 mg | ORAL_TABLET | Freq: Three times a day (TID) | ORAL | 0 refills | Status: DC
Start: 2023-10-08 — End: 2023-11-09

## 2023-11-03 ENCOUNTER — Other Ambulatory Visit: Payer: Self-pay | Admitting: Family Medicine

## 2023-11-03 DIAGNOSIS — I10 Essential (primary) hypertension: Secondary | ICD-10-CM

## 2023-11-09 ENCOUNTER — Ambulatory Visit (INDEPENDENT_AMBULATORY_CARE_PROVIDER_SITE_OTHER): Payer: Medicare Other | Admitting: Family Medicine

## 2023-11-09 ENCOUNTER — Other Ambulatory Visit: Payer: Self-pay | Admitting: Family Medicine

## 2023-11-09 ENCOUNTER — Encounter: Payer: Self-pay | Admitting: Family Medicine

## 2023-11-09 VITALS — BP 166/70 | HR 79 | Resp 16 | Ht 66.0 in | Wt 161.0 lb

## 2023-11-09 DIAGNOSIS — G720 Drug-induced myopathy: Secondary | ICD-10-CM

## 2023-11-09 DIAGNOSIS — T466X5A Adverse effect of antihyperlipidemic and antiarteriosclerotic drugs, initial encounter: Secondary | ICD-10-CM | POA: Diagnosis not present

## 2023-11-09 DIAGNOSIS — I10 Essential (primary) hypertension: Secondary | ICD-10-CM | POA: Diagnosis not present

## 2023-11-09 DIAGNOSIS — R809 Proteinuria, unspecified: Secondary | ICD-10-CM | POA: Diagnosis not present

## 2023-11-09 DIAGNOSIS — E785 Hyperlipidemia, unspecified: Secondary | ICD-10-CM

## 2023-11-09 DIAGNOSIS — Z5181 Encounter for therapeutic drug level monitoring: Secondary | ICD-10-CM | POA: Diagnosis not present

## 2023-11-09 DIAGNOSIS — E1129 Type 2 diabetes mellitus with other diabetic kidney complication: Secondary | ICD-10-CM | POA: Diagnosis not present

## 2023-11-09 DIAGNOSIS — E1169 Type 2 diabetes mellitus with other specified complication: Secondary | ICD-10-CM

## 2023-11-09 DIAGNOSIS — E782 Mixed hyperlipidemia: Secondary | ICD-10-CM | POA: Diagnosis not present

## 2023-11-09 DIAGNOSIS — Z1231 Encounter for screening mammogram for malignant neoplasm of breast: Secondary | ICD-10-CM | POA: Diagnosis not present

## 2023-11-09 MED ORDER — HYDRALAZINE HCL 25 MG PO TABS
25.0000 mg | ORAL_TABLET | Freq: Three times a day (TID) | ORAL | 1 refills | Status: DC
Start: 2023-11-09 — End: 2024-04-28

## 2023-11-09 NOTE — Progress Notes (Unsigned)
Name: Jeanne Hickman   MRN: [id number]    DOB: September 30, 1946   Date:11/09/2023       Progress Note  Chief Complaint  Patient presents with   Medical Management of Chronic Issues    6 months     Subjective:   Jeanne Hickman is a 78 y.o. female, presents to clinic for   DMII: she is on diet only , she also walks most days of the week a total of 3 miles. She has associated HTN, dyslipidemia and microalbuminuria. She is taking Zetia ( due to statin myopathy), ARB for microalbuminuria and HTN and denies side effects. She denies polyphagia, polydipsia or polyuria . A1C was 6.3 % today    HTN: BP is at goal for her,she saw nephrologist years ago and has been on current regiment for a long time. No side effects. No chest pain, palpitation , denies orthostatic hypotension    Dyslipidemia: only taking Zetia, LDL not at goal, discussed switching Nexlizet to get LDL below 70 but she wants to hold off for now, worried about cost   Osteopenia: post menopausal with low FRAX, continue vitamin D and high calcium diet      Current Outpatient Medications:    amLODipine (NORVASC) 10 MG tablet, Take 1 tablet (10 mg total) by mouth daily., Disp: 90 tablet, Rfl: 1   ELDERBERRY PO, Take by mouth. gummies, Disp: , Rfl:    ezetimibe (ZETIA) 10 MG tablet, Take 1 tablet (10 mg total) by mouth daily., Disp: 90 tablet, Rfl: 1   Multiple Vitamin (MULITIVITAMIN WITH MINERALS) TABS, Take 1 tablet by mouth daily., Disp: , Rfl:    valsartan (DIOVAN) 160 MG tablet, Take 1 tablet (160 mg total) by mouth daily., Disp: 90 tablet, Rfl: 1   dapagliflozin propanediol (FARXIGA) 10 MG TABS tablet, Take 1 tablet (10 mg total) by mouth daily before breakfast., Disp: 90 tablet, Rfl: 1   hydrALAZINE (APRESOLINE) 25 MG tablet, Take 1 tablet (25 mg total) by mouth 3 (three) times daily., Disp: 270 tablet, Rfl: 1  Patient Active Problem List   Diagnosis Date Noted   Statin myopathy 11/09/2023   Controlled type 2 diabetes mellitus  with microalbuminuria, without long-term current use of insulin (HCC) 10/08/2020   Essential hypertension 03/17/2019   Mixed hyperlipidemia 03/17/2019   Proteinuria 03/17/2019   Cataract     Past Surgical History:  Procedure Laterality Date   ABDOMINAL HYSTERECTOMY     COLONOSCOPY WITH PROPOFOL N/A 11/17/2021   Procedure: COLONOSCOPY WITH PROPOFOL;  Surgeon: Wyline Mood, MD;  Location: [facility] ENDOSCOPY;  Service: Gastroenterology;  Laterality: N/A;    No family history on file.  Social History   Tobacco Use   Smoking status: Former    Current packs/day: 0.00    Types: Cigarettes    Quit date: 02/28/1978    Years since quitting: 45.7   Smokeless tobacco: Never  Vaping Use   Vaping status: Never Used  Substance Use Topics   Alcohol use: No   Drug use: No     No Known Allergies  Health Maintenance  Topic Date Due   FOOT EXAM  06/16/2023   MAMMOGRAM  07/11/2023   Medicare Annual Wellness (AWV)  10/21/2023   Diabetic kidney evaluation - eGFR measurement  11/17/2023   Diabetic kidney evaluation - Urine ACR  11/17/2023   INFLUENZA VACCINE  01/17/2024 (Originally 05/20/2023)   Zoster Vaccines- Shingrix (1 of 2) 02/07/2024 (Originally 04/26/1996)   HEMOGLOBIN A1C  11/18/2023  OPHTHALMOLOGY EXAM  12/01/2023   DEXA SCAN  01/24/2025   DTaP/Tdap/Td (2 - Td or Tdap) 03/02/2029   Pneumonia Vaccine 26+ Years old  Completed   COVID-19 Vaccine  Completed   Hepatitis C Screening  Completed   HPV VACCINES  Aged Out   Colonoscopy  Discontinued    Chart Review Today: ***  Review of Systems   Objective:   Vitals:   11/09/23 0832  BP: (!) 168/72  Pulse: 79  Resp: 16  SpO2: 99%  Weight: 161 lb (73 kg)  Height: 5\' 6"  (1.676 m)    Body mass index is 25.99 kg/m.  Physical Exam      Assessment & Plan:   Problem List Items Addressed This Visit       Cardiovascular and Mediastinum   Essential hypertension   Relevant Medications   hydrALAZINE (APRESOLINE) 25 MG tablet      Endocrine   Controlled type 2 diabetes mellitus with microalbuminuria, without long-term current use of insulin (HCC)     Musculoskeletal and Integument   Statin myopathy     Other   Mixed hyperlipidemia   Relevant Medications   hydrALAZINE (APRESOLINE) 25 MG tablet   Other Relevant Orders   COMPLETE METABOLIC PANEL WITH GFR   Lipid panel   Other Visit Diagnoses       Dyslipidemia associated with type 2 diabetes mellitus (HCC)    -  Primary   Relevant Orders   HgB A1c   Urine Microalbumin w/creat. ratio   COMPLETE METABOLIC PANEL WITH GFR     Breast cancer screening by mammogram       Relevant Orders   MM 3D SCREENING MAMMOGRAM BILATERAL BREAST     Encounter for medication monitoring       Relevant Orders   HgB A1c   Urine Microalbumin w/creat. ratio   COMPLETE METABOLIC PANEL WITH GFR   Lipid panel   CBC with Differential/Platelet   MM 3D SCREENING MAMMOGRAM BILATERAL BREAST        No follow-ups on file.   Danelle Berry, PA-C 11/09/23 9:13 AM

## 2023-11-09 NOTE — Progress Notes (Unsigned)
DMII: she is on diet only , she also walks most days of the week a total of 3 miles. She has associated HTN, dyslipidemia and microalbuminuria. She is taking Zetia ( due to statin myopathy), ARB for microalbuminuria and HTN and denies side effects. She denies polyphagia, polydipsia or polyuria . A1C was 6.3 % today    HTN: BP is at goal for her,she saw nephrologist years ago and has been on current regiment for a long time. No side effects. No chest pain, palpitation , denies orthostatic hypotension    Dyslipidemia: only taking Zetia, LDL not at goal, discussed switching Nexlizet to get LDL below 70 but she wants to hold off for now, worried about cost   Osteopenia: post menopausal with low FRAX, continue vitamin D and high calcium diet

## 2023-11-10 ENCOUNTER — Other Ambulatory Visit: Payer: Self-pay | Admitting: Family Medicine

## 2023-11-10 ENCOUNTER — Encounter: Payer: Self-pay | Admitting: Family Medicine

## 2023-11-10 DIAGNOSIS — E1129 Type 2 diabetes mellitus with other diabetic kidney complication: Secondary | ICD-10-CM

## 2023-11-10 LAB — CBC WITH DIFFERENTIAL/PLATELET
Absolute Lymphocytes: 1740 {cells}/uL (ref 850–3900)
Absolute Monocytes: 585 {cells}/uL (ref 200–950)
Basophils Absolute: 38 {cells}/uL (ref 0–200)
Basophils Relative: 0.5 %
Eosinophils Absolute: 38 {cells}/uL (ref 15–500)
Eosinophils Relative: 0.5 %
HCT: 40.9 % (ref 35.0–45.0)
Hemoglobin: 13 g/dL (ref 11.7–15.5)
MCH: 27 pg (ref 27.0–33.0)
MCHC: 31.8 g/dL — ABNORMAL LOW (ref 32.0–36.0)
MCV: 84.9 fL (ref 80.0–100.0)
MPV: 11.2 fL (ref 7.5–12.5)
Monocytes Relative: 7.7 %
Neutro Abs: 5198 {cells}/uL (ref 1500–7800)
Neutrophils Relative %: 68.4 %
Platelets: 293 10*3/uL (ref 140–400)
RBC: 4.82 10*6/uL (ref 3.80–5.10)
RDW: 13.7 % (ref 11.0–15.0)
Total Lymphocyte: 22.9 %
WBC: 7.6 10*3/uL (ref 3.8–10.8)

## 2023-11-10 LAB — COMPLETE METABOLIC PANEL WITH GFR
AG Ratio: 1.5 (calc) (ref 1.0–2.5)
ALT: 14 U/L (ref 6–29)
AST: 16 U/L (ref 10–35)
Albumin: 4.6 g/dL (ref 3.6–5.1)
Alkaline phosphatase (APISO): 69 U/L (ref 37–153)
BUN: 14 mg/dL (ref 7–25)
CO2: 30 mmol/L (ref 20–32)
Calcium: 9.7 mg/dL (ref 8.6–10.4)
Chloride: 104 mmol/L (ref 98–110)
Creat: 0.77 mg/dL (ref 0.60–1.00)
Globulin: 3.1 g/dL (ref 1.9–3.7)
Glucose, Bld: 126 mg/dL — ABNORMAL HIGH (ref 65–99)
Potassium: 3.7 mmol/L (ref 3.5–5.3)
Sodium: 143 mmol/L (ref 135–146)
Total Bilirubin: 0.6 mg/dL (ref 0.2–1.2)
Total Protein: 7.7 g/dL (ref 6.1–8.1)
eGFR: 79 mL/min/{1.73_m2} (ref >=60)

## 2023-11-10 LAB — HEMOGLOBIN A1C
Hgb A1c MFr Bld: 6.8 %{Hb} — ABNORMAL HIGH (ref <5.7)
Mean Plasma Glucose: 148 mg/dL
eAG (mmol/L): 8.2 mmol/L

## 2023-11-10 LAB — MICROALBUMIN / CREATININE URINE RATIO
Creatinine, Urine: 90 mg/dL (ref 20–275)
Microalb Creat Ratio: 1588 mg/g{creat} — ABNORMAL HIGH (ref <30)
Microalb, Ur: 142.9 mg/dL

## 2023-11-10 LAB — LIPID PANEL
Cholesterol: 215 mg/dL — ABNORMAL HIGH (ref <200)
HDL: 72 mg/dL (ref >=50)
LDL Cholesterol (Calc): 121 mg/dL — ABNORMAL HIGH
Non-HDL Cholesterol (Calc): 143 mg/dL — ABNORMAL HIGH (ref <130)
Total CHOL/HDL Ratio: 3 (calc) (ref <5.0)
Triglycerides: 110 mg/dL (ref <150)

## 2023-11-10 MED ORDER — DAPAGLIFLOZIN PROPANEDIOL 10 MG PO TABS: 10.0000 mg | ORAL_TABLET | Freq: Every day | ORAL | 1 refills | Status: DC

## 2023-12-02 DIAGNOSIS — Z961 Presence of intraocular lens: Secondary | ICD-10-CM | POA: Diagnosis not present

## 2023-12-02 DIAGNOSIS — H26492 Other secondary cataract, left eye: Secondary | ICD-10-CM | POA: Diagnosis not present

## 2023-12-02 DIAGNOSIS — H35033 Hypertensive retinopathy, bilateral: Secondary | ICD-10-CM | POA: Diagnosis not present

## 2023-12-27 ENCOUNTER — Other Ambulatory Visit: Payer: Self-pay | Admitting: Family Medicine

## 2023-12-27 DIAGNOSIS — H26493 Other secondary cataract, bilateral: Secondary | ICD-10-CM | POA: Diagnosis not present

## 2023-12-27 DIAGNOSIS — H35042 Retinal micro-aneurysms, unspecified, left eye: Secondary | ICD-10-CM | POA: Diagnosis not present

## 2023-12-27 DIAGNOSIS — H3561 Retinal hemorrhage, right eye: Secondary | ICD-10-CM | POA: Diagnosis not present

## 2023-12-27 DIAGNOSIS — I10 Essential (primary) hypertension: Secondary | ICD-10-CM

## 2023-12-27 DIAGNOSIS — E1129 Type 2 diabetes mellitus with other diabetic kidney complication: Secondary | ICD-10-CM

## 2023-12-27 DIAGNOSIS — H35033 Hypertensive retinopathy, bilateral: Secondary | ICD-10-CM | POA: Diagnosis not present

## 2023-12-27 MED ORDER — EMPAGLIFLOZIN 25 MG PO TABS
25.0000 mg | ORAL_TABLET | Freq: Every day | ORAL | 2 refills | Status: AC
Start: 2023-12-27 — End: ?

## 2023-12-27 NOTE — Progress Notes (Signed)
 Pt can see what jardiance cost/copay is and try that in place of farxiga

## 2023-12-28 ENCOUNTER — Other Ambulatory Visit: Payer: Self-pay | Admitting: Family Medicine

## 2023-12-28 DIAGNOSIS — I10 Essential (primary) hypertension: Secondary | ICD-10-CM

## 2023-12-28 DIAGNOSIS — E782 Mixed hyperlipidemia: Secondary | ICD-10-CM

## 2023-12-28 DIAGNOSIS — T466X5A Adverse effect of antihyperlipidemic and antiarteriosclerotic drugs, initial encounter: Secondary | ICD-10-CM

## 2023-12-28 DIAGNOSIS — E1129 Type 2 diabetes mellitus with other diabetic kidney complication: Secondary | ICD-10-CM

## 2023-12-28 DIAGNOSIS — E1169 Type 2 diabetes mellitus with other specified complication: Secondary | ICD-10-CM

## 2023-12-30 ENCOUNTER — Other Ambulatory Visit: Payer: Self-pay | Admitting: Family Medicine

## 2023-12-30 DIAGNOSIS — E1129 Type 2 diabetes mellitus with other diabetic kidney complication: Secondary | ICD-10-CM

## 2024-01-05 ENCOUNTER — Telehealth: Payer: Self-pay

## 2024-01-05 NOTE — Progress Notes (Signed)
 Care Guide Pharmacy Note  01/05/2024 Name: Jeanne Hickman MRN: [id number] DOB: 11/17/1945  Referred By: Danelle Berry, PA-C Reason for referral: Care Coordination (Outreach to schedule with Pharm d )   Jeanne Hickman is a 78 y.o. year old female who is a primary care patient of Danelle Berry, [place].  Jeanne Hickman was referred to the pharmacist for assistance related to: DMII  Successful contact was made with the patient to discuss pharmacy services including being ready for the pharmacist to call at least 5 minutes before the scheduled appointment time and to have medication bottles and any blood pressure readings ready for review. The patient agreed to meet with the pharmacist via telephone visit on (date/time).01/07/2024  Penne Lash , RMA     [place]  [facility], [facility] Guide  Direct Dial: [phone]  Website: [email or web address]

## 2024-01-07 ENCOUNTER — Other Ambulatory Visit: Payer: Self-pay | Admitting: Pharmacist

## 2024-01-07 ENCOUNTER — Encounter: Payer: Self-pay | Admitting: Pharmacist

## 2024-01-07 DIAGNOSIS — E1129 Type 2 diabetes mellitus with other diabetic kidney complication: Secondary | ICD-10-CM

## 2024-01-07 NOTE — Progress Notes (Signed)
 01/07/2024 Name: Jeanne Hickman MRN: [id number] DOB: 1946/08/29  Chief Complaint  Patient presents with   Medication Assistance    Jeanne Hickman is a 78 y.o. year old female who presented for a telephone visit.   They were referred to the pharmacist by their PCP for assistance in managing medication access.    Subjective:  Care Team: Primary Care Provider: Danelle Berry, Cordelia Poche ; Next Scheduled Visit: 05/08/2024   Medication Access/Adherence  Current Pharmacy:  [facility] [address]), [place] - [address] [place] ([place]) [place] [address] Phone: [phone] Fax: [phone]   Patient reports affordability concerns with their medications: Yes  Patient reports access/transportation concerns to their pharmacy: No  Patient reports adherence concerns with their medications:  No     Diabetes:  Current medications: none  Medications tried/barriers in the past: [place] (cost)  Reports has not yet been able to start Jardiance as prescribed by PCP on 3/10 due to the medication cost  Denies monitoring home blood sugar  Current physical activity: Walks 3 miles x most days   Objective:  Lab Results  Component Value Date   HGBA1C 6.8 (H) 11/09/2023    Lab Results  Component Value Date   CREATININE 0.77 11/09/2023   BUN 14 11/09/2023   NA 143 11/09/2023   K 3.7 11/09/2023   CL 104 11/09/2023   CO2 30 11/09/2023    Lab Results  Component Value Date   CHOL 215 (H) 11/09/2023   HDL 72 11/09/2023   LDLCALC 121 (H) 11/09/2023   TRIG 110 11/09/2023   CHOLHDL 3.0 11/09/2023    Medications Reviewed Today     Reviewed by Jeanne Hickman, RPH-CPP (Pharmacist) on 01/07/24 at 0830  Med List Status: <None>   Medication Order Taking? Sig Documenting Provider Last Dose Status Informant  amLODipine (NORVASC) 10 MG tablet [id number] Yes TAKE 1 TABLET BY MOUTH ONCE DAILY.  WILL NEED AN OFFICE VISIT FOR FURTHER REFILLS  Sander Radon Taking Active   ELDERBERRY PO [id number] Yes Take by mouth. gummies [provider] Taking Active Self  empagliflozin (JARDIANCE) 25 MG TABS tablet [id number]  Take 1 tablet (25 mg total) by mouth daily before breakfast. Danelle Berry, PA-C  Active   ezetimibe (ZETIA) 10 MG tablet [id number] Yes Take 1 tablet by mouth once daily Danelle Berry, PA-C Taking Active   hydrALAZINE (APRESOLINE) 25 MG tablet [id number] Yes Take 1 tablet (25 mg total) by mouth 3 (three) times daily. Danelle Berry, PA-C Taking Active   Multiple Vitamin (MULITIVITAMIN WITH MINERALS) TABS [id number] Yes Take 1 tablet by mouth daily. [provider] Taking Active Self  valsartan (DIOVAN) 160 MG tablet [id number] Yes Take 1 tablet by mouth once daily Danelle Berry, PA-C Taking Active               Assessment/Plan:   Per review of [employer], patient has an annual deductible of $255 (tiers 3-5). Jeanne Hickman is a tier 3 medication through her plan with copayment of $47/month after her deductible is met  Note this cost is unaffordable to patient  Diabetes: - Currently controlled - Reviewed long term cardiovascular and renal outcomes of uncontrolled blood sugar - Reviewed goal A1c, goal fasting, and goal 2 hour post prandial glucose - Reviewed dietary modifications including importance of having regular well-balanced meals and snacks throughout the day, while controlling carbohydrate portion sizes  Encourage to review nutrition labels for carbohydrate content of foods -  Send patient handout on healthy meal planning as requested - Based on reported income, patient meets criteria for Extra Help subsidy through Social Security Patient states will complete this application online. Send patient MyChart message with details on how to apply for Extra Help online - Counsel to expect response regarding approval or denial from Social Security via mail within the next 4 weeks after submitting  application online. Advise that even if she is denied, to retain denial letter as this will be needed for applying for manufacturer patient assistance program.  If patient denied for Extra Help, will collaborate with PCP and CPhT to aid patient with applying for patient assistance program   Follow Up Plan: Clinical Pharmacist will follow up with patient by telephone on 02/16/2024 at 8:30 AM   Jeanne Hickman, PharmD, [facility] [phone]

## 2024-01-07 NOTE — Patient Instructions (Signed)
 Goals Addressed             This Visit's Progress    Pharmacy Goals       You can access the Social Security site for the Extra Help application online at: [email or web address]   Please watch for a response from Social Security by mail within the next 4 weeks to let you know if this application is approved or denied. For either outcome, please keep this letter for your records.   If your application for Extra Help is denied, we can assist you with applying to receive your Jardiance directly from the manufacturer at no cost.     The following is the web address for the meal planning handout that we discussed:   [email or web address]   I look forward to speaking with you again on 02/16/2024 at 8:30 AM.   Please feel free to call me sooner if you have any medication-related questions or concerns!   Estelle Grumbles, PharmD, [facility] [phone]

## 2024-02-16 ENCOUNTER — Other Ambulatory Visit (HOSPITAL_COMMUNITY): Payer: Self-pay

## 2024-02-16 ENCOUNTER — Telehealth: Payer: Self-pay

## 2024-02-16 ENCOUNTER — Telehealth: Payer: Self-pay | Admitting: Pharmacist

## 2024-02-16 ENCOUNTER — Encounter: Payer: Self-pay | Admitting: Pharmacist

## 2024-02-16 NOTE — Telephone Encounter (Signed)
 Pharmacy Patient Advocate Encounter  Insurance verification completed.   The patient is insured through [employer] test claim for Jardiance . Currently a quantity of 30 is a 30 day supply and the co-pay is $302 .   This test claim was processed through [facility]- copay amounts may vary at other pharmacies due to pharmacy/plan contracts, or as the patient moves through the different stages of their insurance plan.

## 2024-02-16 NOTE — Progress Notes (Signed)
   Outreach Note  02/16/2024 Name: UYEN SHELLY MRN: [id number] DOB: [birth year]  Referred by: Adeline Hone, PA-C  Reach patient by telephone today as scheduled, but reports now is not a good time to talk because she is driving.    Follow Up Plan: Will collaborate with Care Guide to outreach to schedule follow up with me  Arthur Lash, PharmD, [facility] [phone]

## 2024-03-22 ENCOUNTER — Other Ambulatory Visit: Payer: Self-pay | Admitting: Family Medicine

## 2024-03-22 DIAGNOSIS — I10 Essential (primary) hypertension: Secondary | ICD-10-CM

## 2024-03-23 NOTE — Telephone Encounter (Signed)
 Requested Prescriptions  Pending Prescriptions Disp Refills   amLODipine  (NORVASC ) 10 MG tablet [Pharmacy Med Name: amLODIPine  Besylate 10 MG Oral Tablet] 90 tablet 0    Sig: TAKE 1 TABLET BY MOUTH ONCE DAILY . APPOINTMENT REQUIRED FOR FUTURE REFILLS     Cardiovascular: Calcium  Channel Blockers 2 Failed - 03/23/2024 10:27 AM      Failed - Last BP in normal range    BP Readings from Last 1 Encounters:  11/09/23 (!) 166/70         Failed - Valid encounter within last 6 months    Recent Outpatient Visits   None     Future Appointments             In 1 month Adeline Hone, PA-C [facility], [facility]            Passed - Last Heart Rate in normal range    Pulse Readings from Last 1 Encounters:  11/09/23 79

## 2024-03-26 ENCOUNTER — Other Ambulatory Visit: Payer: Self-pay | Admitting: Family Medicine

## 2024-03-26 DIAGNOSIS — E1129 Type 2 diabetes mellitus with other diabetic kidney complication: Secondary | ICD-10-CM

## 2024-03-26 DIAGNOSIS — G720 Drug-induced myopathy: Secondary | ICD-10-CM

## 2024-03-26 DIAGNOSIS — I10 Essential (primary) hypertension: Secondary | ICD-10-CM

## 2024-03-26 DIAGNOSIS — E1169 Type 2 diabetes mellitus with other specified complication: Secondary | ICD-10-CM

## 2024-03-26 DIAGNOSIS — E782 Mixed hyperlipidemia: Secondary | ICD-10-CM

## 2024-03-27 NOTE — Telephone Encounter (Signed)
 Requested Prescriptions  Pending Prescriptions Disp Refills   ezetimibe  (ZETIA ) 10 MG tablet [Pharmacy Med Name: Ezetimibe  10 MG Oral Tablet] 90 tablet 0    Sig: Take 1 tablet by mouth once daily     Cardiovascular:  Antilipid - Sterol Transport Inhibitors Failed - 03/27/2024  5:26 PM      Failed - Valid encounter within last 12 months    Recent Outpatient Visits   None     Future Appointments             In 1 month Tapia, Leisa, PA-C [facility], [facility]            Failed - Lipid Panel in normal range within the last 12 months    Cholesterol  Date Value Ref Range Status  11/09/2023 215 (H) <200 mg/dL Final   LDL Cholesterol (Calc)  Date Value Ref Range Status  11/09/2023 121 (H) mg/dL (calc) Final    Comment:    Reference range: <100 . Desirable range <100 mg/dL for primary prevention;   <70 mg/dL for patients with CHD or diabetic patients  with > or = 2 CHD risk factors. Aaron Aas LDL-C is now calculated using the Martin-Hopkins  calculation, which is a validated novel method providing  better accuracy than the Friedewald equation in the  estimation of LDL-C.  Melinda Sprawls et al. Erroll Heard. 1308;657(84): 2061-2068  (http://education.QuestDiagnostics.com/faq/FAQ164)    HDL  Date Value Ref Range Status  11/09/2023 72 > OR = 50 mg/dL Final   Triglycerides  Date Value Ref Range Status  11/09/2023 110 <150 mg/dL Final         Passed - AST in normal range and within 360 days    AST  Date Value Ref Range Status  11/09/2023 16 10 - 35 U/L Final         Passed - ALT in normal range and within 360 days    ALT  Date Value Ref Range Status  11/09/2023 14 6 - 29 U/L Final         Passed - Patient is not pregnant       valsartan  (DIOVAN ) 160 MG tablet [Pharmacy Med Name: Valsartan  160 MG Oral Tablet] 90 tablet 0    Sig: Take 1 tablet by mouth once daily     Cardiovascular:  Angiotensin Receptor Blockers Failed - 03/27/2024  5:26 PM      Failed - Last  BP in normal range    BP Readings from Last 1 Encounters:  11/09/23 (!) 166/70         Failed - Valid encounter within last 6 months    Recent Outpatient Visits   None     Future Appointments             In 1 month Adeline Hone, PA-C [facility], [facility]            Passed - Cr in normal range and within 180 days    Creat  Date Value Ref Range Status  11/09/2023 0.77 0.60 - 1.00 mg/dL Final   Creatinine, Urine  Date Value Ref Range Status  11/09/2023 90 20 - 275 mg/dL Final         Passed - K in normal range and within 180 days    Potassium  Date Value Ref Range Status  11/09/2023 3.7 3.5 - 5.3 mmol/L Final         Passed - Patient is not pregnant

## 2024-04-03 ENCOUNTER — Encounter: Payer: Self-pay | Admitting: Pharmacist

## 2024-04-03 ENCOUNTER — Other Ambulatory Visit: Payer: Self-pay | Admitting: Pharmacist

## 2024-04-03 DIAGNOSIS — E1129 Type 2 diabetes mellitus with other diabetic kidney complication: Secondary | ICD-10-CM

## 2024-04-03 DIAGNOSIS — R809 Proteinuria, unspecified: Secondary | ICD-10-CM

## 2024-04-03 NOTE — Progress Notes (Signed)
   04/03/2024 Name: JADELYN ELKS MRN: [id number] DOB: 1945/12/26  Chief Complaint  Patient presents with   Medication Assistance   DETRICE CALES is a 78 y.o. year old female who was referred to the pharmacist by their PCP for assistance in managing medication access for Jardiance  for diabetic kidney disease    Outreach to patient today to follow up regarding medication assistance.   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 Verbalizes understanding of plan to complete Extra Help application and then expect a response in the mail within 4-6 weeks.  - States will plan to complete this application by the end of the week  Remind patient that if denied for Extra Help, will plan to collaborate with PCP and CPhT to aid patient with applying for patient assistance program   Resend patient MyChart message with details on how to apply for Extra Help online as requested  Follow Up Plan: Clinical Pharmacist will follow up with patient by telephone on 05/31/2024 at 10:30 AM     Arthur Lash, PharmD, [facility] [phone]

## 2024-04-03 NOTE — Patient Instructions (Signed)
 You can access the Social Security site for the Extra Help application online at: [email or web address]   Please watch for a response from Social Security by mail within the next 4 to 6 weeks to let you know if this application is approved or denied. For either outcome, please keep this letter for your records.   If your application for Extra Help is denied, we can assist you with applying to receive your Jardiance  directly from the manufacturer at no cost.     Arthur Lash, PharmD, [facility] [phone]

## 2024-04-27 ENCOUNTER — Other Ambulatory Visit: Payer: Self-pay | Admitting: Family Medicine

## 2024-04-27 DIAGNOSIS — I10 Essential (primary) hypertension: Secondary | ICD-10-CM

## 2024-04-28 NOTE — Telephone Encounter (Signed)
 Requested Prescriptions  Pending Prescriptions Disp Refills   hydrALAZINE  (APRESOLINE ) 25 MG tablet [Pharmacy Med Name: hydrALAZINE  HCl 25 MG Oral Tablet] 270 tablet 0    Sig: TAKE 1 TABLET BY MOUTH THREE TIMES DAILY     Cardiovascular:  Vasodilators Failed - 04/28/2024  3:00 PM      Failed - ANA Screen, Ifa, Serum in normal range and within 360 days    No results found for: ANA, ANATITER, LABANTI       Failed - Last BP in normal range    BP Readings from Last 1 Encounters:  11/09/23 (!) 166/70         Failed - Valid encounter within last 12 months    Recent Outpatient Visits   None            Passed - HCT in normal range and within 360 days    HCT  Date Value Ref Range Status  11/09/2023 40.9 35.0 - 45.0 % Final         Passed - HGB in normal range and within 360 days    Hemoglobin  Date Value Ref Range Status  11/09/2023 13.0 11.7 - 15.5 g/dL Final         Passed - RBC in normal range and within 360 days    RBC  Date Value Ref Range Status  11/09/2023 4.82 3.80 - 5.10 Million/uL Final         Passed - WBC in normal range and within 360 days    WBC  Date Value Ref Range Status  11/09/2023 7.6 3.8 - 10.8 Thousand/uL Final         Passed - PLT in normal range and within 360 days    Platelets  Date Value Ref Range Status  11/09/2023 293 140 - 400 Thousand/uL Final

## 2024-05-08 ENCOUNTER — Ambulatory Visit: Payer: Self-pay | Admitting: Family Medicine

## 2024-05-22 DIAGNOSIS — Z961 Presence of intraocular lens: Secondary | ICD-10-CM | POA: Diagnosis not present

## 2024-05-22 DIAGNOSIS — H35033 Hypertensive retinopathy, bilateral: Secondary | ICD-10-CM | POA: Diagnosis not present

## 2024-05-22 DIAGNOSIS — H26491 Other secondary cataract, right eye: Secondary | ICD-10-CM | POA: Diagnosis not present

## 2024-05-31 ENCOUNTER — Other Ambulatory Visit: Payer: Self-pay | Admitting: Pharmacist

## 2024-06-02 ENCOUNTER — Other Ambulatory Visit: Payer: Self-pay | Admitting: Pharmacist

## 2024-06-02 ENCOUNTER — Encounter: Payer: Self-pay | Admitting: Pharmacist

## 2024-06-02 DIAGNOSIS — E1129 Type 2 diabetes mellitus with other diabetic kidney complication: Secondary | ICD-10-CM

## 2024-06-02 NOTE — Progress Notes (Signed)
   06/02/2024  Patient ID: Jeanne Hickman, female   DOB: [birth year], 78 y.o.   MRN: [id number]  Jeanne Hickman is a 78 y.o. year old female who was referred to the pharmacist by their PCP for assistance in managing medication access for Jardiance  for diabetic kidney disease   Outreach to patient today to follow up regarding medication assistance.    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 Verbalizes understanding of plan to complete Extra Help application and then expect a response in the mail within 4-6 weeks.  - States will plan to complete this application within the next week   Remind patient that if denied for Extra Help, will plan to collaborate with PCP and CPhT to aid patient with applying for patient assistance program    Resend patient MyChart message with details on how to apply for Extra Help online as requested   Follow Up Plan: As requested, Clinical Pharmacist will follow up with patient by telephone in 2 weeks   Sharyle Sia, PharmD, [facility] [phone]

## 2024-06-02 NOTE — Patient Instructions (Signed)
 You can access the Social Security site for the Extra Help application online at: [email or web address]   Please watch for a response from Social Security by mail within the next 4 to 6 weeks to let you know if this application is approved or denied. For either outcome, please keep this letter for your records.   If your application for Extra Help is denied, we can assist you with applying to receive your Jardiance directly from the manufacturer at no cost.     Sharyle Sia, PharmD, [facility] [phone]

## 2024-06-14 ENCOUNTER — Telehealth: Payer: Self-pay | Admitting: Pharmacist

## 2024-06-14 ENCOUNTER — Other Ambulatory Visit: Payer: Self-pay | Admitting: Pharmacist

## 2024-06-14 NOTE — Progress Notes (Signed)
   Outreach Note  06/14/2024 Name: Jeanne Hickman MRN: [id number] DOB: Aug 16, 1946  Referred by: Leavy Mole, PA-C  Was unable to reach patient via telephone today and have left HIPAA compliant voicemail asking patient to return my call.   Sharyle Sia, PharmD, [facility] [phone]

## 2024-06-22 ENCOUNTER — Other Ambulatory Visit: Payer: Self-pay | Admitting: Family Medicine

## 2024-06-22 DIAGNOSIS — T466X5A Adverse effect of antihyperlipidemic and antiarteriosclerotic drugs, initial encounter: Secondary | ICD-10-CM

## 2024-06-22 DIAGNOSIS — E1129 Type 2 diabetes mellitus with other diabetic kidney complication: Secondary | ICD-10-CM

## 2024-06-22 DIAGNOSIS — E782 Mixed hyperlipidemia: Secondary | ICD-10-CM

## 2024-06-22 DIAGNOSIS — I10 Essential (primary) hypertension: Secondary | ICD-10-CM

## 2024-06-22 DIAGNOSIS — E1169 Type 2 diabetes mellitus with other specified complication: Secondary | ICD-10-CM

## 2024-07-24 ENCOUNTER — Other Ambulatory Visit: Payer: Self-pay | Admitting: Family Medicine

## 2024-07-24 DIAGNOSIS — I10 Essential (primary) hypertension: Secondary | ICD-10-CM

## 2024-07-25 ENCOUNTER — Other Ambulatory Visit: Payer: Self-pay | Admitting: Family Medicine

## 2024-07-25 DIAGNOSIS — I10 Essential (primary) hypertension: Secondary | ICD-10-CM

## 2024-07-25 NOTE — Telephone Encounter (Signed)
 Requested medications are due for refill today.  yes  Requested medications are on the active medications list.  yes  Last refill. 06/22/2024 #30 0 rf  Future visit scheduled.   no  Notes to clinic.  Pt is more than 3 months overdue for an ov. Pt already received a courtesy refill.    Requested Prescriptions  Pending Prescriptions Disp Refills   amLODipine  (NORVASC ) 10 MG tablet [Pharmacy Med Name: amLODIPine  Besylate 10 MG Oral Tablet] 30 tablet 0    Sig: TAKE 1 TABLET BY MOUTH ONCE DAILY . APPOINTMENT REQUIRED FOR FUTURE REFILLS     Cardiovascular: Calcium  Channel Blockers 2 Failed - 07/25/2024  3:39 PM      Failed - Last BP in normal range    BP Readings from Last 1 Encounters:  11/09/23 (!) 166/70         Failed - Valid encounter within last 6 months    Recent Outpatient Visits   None            Passed - Last Heart Rate in normal range    Pulse Readings from Last 1 Encounters:  11/09/23 79

## 2024-07-26 NOTE — Telephone Encounter (Signed)
 OV 11/09/23 Requested Prescriptions  Pending Prescriptions Disp Refills   hydrALAZINE  (APRESOLINE ) 25 MG tablet [Pharmacy Med Name: hydrALAZINE  HCl 25 MG Oral Tablet] 270 tablet 0    Sig: TAKE 1 TABLET BY MOUTH THREE TIMES DAILY     Cardiovascular:  Vasodilators Failed - 07/26/2024  3:11 PM      Failed - ANA Screen, Ifa, Serum in normal range and within 360 days    No results found for: ANA, ANATITER, LABANTI       Failed - Last BP in normal range    BP Readings from Last 1 Encounters:  11/09/23 (!) 166/70         Failed - Valid encounter within last 12 months    Recent Outpatient Visits   None            Passed - HCT in normal range and within 360 days    HCT  Date Value Ref Range Status  11/09/2023 40.9 35.0 - 45.0 % Final         Passed - HGB in normal range and within 360 days    Hemoglobin  Date Value Ref Range Status  11/09/2023 13.0 11.7 - 15.5 g/dL Final         Passed - RBC in normal range and within 360 days    RBC  Date Value Ref Range Status  11/09/2023 4.82 3.80 - 5.10 Million/uL Final         Passed - WBC in normal range and within 360 days    WBC  Date Value Ref Range Status  11/09/2023 7.6 3.8 - 10.8 Thousand/uL Final         Passed - PLT in normal range and within 360 days    Platelets  Date Value Ref Range Status  11/09/2023 293 140 - 400 Thousand/uL Final

## 2024-08-01 ENCOUNTER — Other Ambulatory Visit: Payer: Self-pay | Admitting: Family Medicine

## 2024-08-01 DIAGNOSIS — I10 Essential (primary) hypertension: Secondary | ICD-10-CM

## 2024-08-01 DIAGNOSIS — E1129 Type 2 diabetes mellitus with other diabetic kidney complication: Secondary | ICD-10-CM

## 2024-08-03 NOTE — Telephone Encounter (Signed)
 Needs appointment- courtesy already given Requested Prescriptions  Pending Prescriptions Disp Refills   valsartan  (DIOVAN ) 160 MG tablet [Pharmacy Med Name: Valsartan  160 MG Oral Tablet] 30 tablet 0    Sig: Take 1 tablet by mouth once daily     Cardiovascular:  Angiotensin Receptor Blockers Failed - 08/03/2024 11:32 AM      Failed - Cr in normal range and within 180 days    Creat  Date Value Ref Range Status  11/09/2023 0.77 0.60 - 1.00 mg/dL Final   Creatinine, Urine  Date Value Ref Range Status  11/09/2023 90 20 - 275 mg/dL Final         Failed - K in normal range and within 180 days    Potassium  Date Value Ref Range Status  11/09/2023 3.7 3.5 - 5.3 mmol/L Final         Failed - Last BP in normal range    BP Readings from Last 1 Encounters:  11/09/23 (!) 166/70         Failed - Valid encounter within last 6 months    Recent Outpatient Visits   None            Passed - Patient is not pregnant

## 2024-08-07 ENCOUNTER — Other Ambulatory Visit: Payer: Self-pay | Admitting: Family Medicine

## 2024-08-07 DIAGNOSIS — R809 Proteinuria, unspecified: Secondary | ICD-10-CM

## 2024-08-07 DIAGNOSIS — I10 Essential (primary) hypertension: Secondary | ICD-10-CM

## 2024-08-08 NOTE — Telephone Encounter (Signed)
 Requested medications are due for refill today.  yes  Requested medications are on the active medications list.  yes  Last refill. 06/22/2024 #30 0 rf  Future visit scheduled.   no  Notes to clinic.  Courtesy refill already given. Labs are expired.     Requested Prescriptions  Pending Prescriptions Disp Refills   valsartan  (DIOVAN ) 160 MG tablet [Pharmacy Med Name: Valsartan  160 MG Oral Tablet] 30 tablet 0    Sig: Take 1 tablet by mouth once daily     Cardiovascular:  Angiotensin Receptor Blockers Failed - 08/08/2024  5:24 PM      Failed - Cr in normal range and within 180 days    Creat  Date Value Ref Range Status  11/09/2023 0.77 0.60 - 1.00 mg/dL Final   Creatinine, Urine  Date Value Ref Range Status  11/09/2023 90 20 - 275 mg/dL Final         Failed - K in normal range and within 180 days    Potassium  Date Value Ref Range Status  11/09/2023 3.7 3.5 - 5.3 mmol/L Final         Failed - Last BP in normal range    BP Readings from Last 1 Encounters:  11/09/23 (!) 166/70         Failed - Valid encounter within last 6 months    Recent Outpatient Visits   None            Passed - Patient is not pregnant

## 2024-09-25 ENCOUNTER — Telehealth: Payer: Self-pay

## 2024-09-25 NOTE — Telephone Encounter (Signed)
 Refill request on   ezetimibe  (ZETIA ) 10 MG tablet

## 2024-09-25 NOTE — Telephone Encounter (Signed)
 Lvm to schedule an appt with Brittany Wall

## 2024-10-06 ENCOUNTER — Encounter: Payer: Self-pay | Admitting: Family Medicine

## 2024-10-06 ENCOUNTER — Ambulatory Visit (INDEPENDENT_AMBULATORY_CARE_PROVIDER_SITE_OTHER): Admitting: Family Medicine

## 2024-10-06 VITALS — BP 182/96 | HR 95 | Resp 16 | Ht 66.0 in | Wt 157.0 lb

## 2024-10-06 DIAGNOSIS — T466X5A Adverse effect of antihyperlipidemic and antiarteriosclerotic drugs, initial encounter: Secondary | ICD-10-CM

## 2024-10-06 DIAGNOSIS — E785 Hyperlipidemia, unspecified: Secondary | ICD-10-CM

## 2024-10-06 DIAGNOSIS — I10 Essential (primary) hypertension: Secondary | ICD-10-CM

## 2024-10-06 DIAGNOSIS — R011 Cardiac murmur, unspecified: Secondary | ICD-10-CM

## 2024-10-06 DIAGNOSIS — E1169 Type 2 diabetes mellitus with other specified complication: Secondary | ICD-10-CM

## 2024-10-06 DIAGNOSIS — G720 Drug-induced myopathy: Secondary | ICD-10-CM

## 2024-10-06 MED ORDER — EZETIMIBE 10 MG PO TABS: 10.0000 mg | ORAL_TABLET | Freq: Every day | ORAL | 0 refills | Status: AC

## 2024-10-06 MED ORDER — HYDRALAZINE HCL 25 MG PO TABS: 25.0000 mg | ORAL_TABLET | Freq: Three times a day (TID) | ORAL | 0 refills | Status: AC

## 2024-10-06 MED ORDER — VALSARTAN 160 MG PO TABS: 160.0000 mg | ORAL_TABLET | Freq: Every day | ORAL | 0 refills | Status: AC

## 2024-10-06 MED ORDER — AMLODIPINE BESYLATE 10 MG PO TABS: 10.0000 mg | ORAL_TABLET | Freq: Every day | ORAL | 0 refills | Status: AC

## 2024-10-06 NOTE — Assessment & Plan Note (Deleted)
 SABRA

## 2024-10-06 NOTE — Assessment & Plan Note (Addendum)
 Pleasant 78 year old female seen in office today for medication refills. Dyslipidemia with associated type 2 DM. Dyslipidemia managed with Zetia  due to reported statin intolerance, statin myopathy. Discussed with patient that lipid panel is not at goal with elevated LDL when last checked 10/2023 at 121. She reports she is active and overall diet is well balanced. Proteins consisting of mainly baked or grilled chicken or salmon. She states after talking with a friend she plans to stop using creamer in her coffee to see if this helps. Jardiance  on medication list, however she reports she never started this due to cost. A1c when last checked in 10/2023 was at goal resulting at 6.8  -Reviewed A1c goal of less than 7. Advising last A1c was at goal. -Encouraged continued lifestyle modifications focusing on dietary changes and continued regular exercise. -Refill Zetia  10mg  PO once daily  Orders:   HgB A1c   Comprehensive Metabolic Panel (CMET)   Urine Microalbumin w/creat. ratio   Lipid Profile   ezetimibe  (ZETIA ) 10 MG tablet; Take 1 tablet (10 mg total) by mouth daily.

## 2024-10-06 NOTE — Progress Notes (Signed)
 "  Established Patient Office Visit  Subjective   Patient ID: Jeanne Hickman, female    DOB: Aug 21, 1946  Age: 78 y.o. MRN: [id number]  Chief Complaint  Patient presents with   Medication Refill    HPI Patient is a 78 year old female seen today for medication refills. BP significantly elevated however she has been out of antihypertensive medications, both Valsartan  and Amlodipine . She states she took her last pill for Valsartan  this morning. It has been over one week since she has taken Amlodipine . She voices she has been going through something with her son in [place] so she has not be in town.    Review of Systems  Respiratory:  Negative for shortness of breath.   Cardiovascular:  Negative for chest pain.  Neurological:  Negative for dizziness and headaches.      Objective:     BP (!) 182/96   Pulse 95   Resp 16   Ht 5' 6 (1.676 m)   Wt 157 lb (71.2 kg)   SpO2 97%   BMI 25.34 kg/m  BP Readings from Last 3 Encounters:  10/06/24 (!) 182/96  11/09/23 (!) 166/70  05/18/23 136/72   Wt Readings from Last 3 Encounters:  10/06/24 157 lb (71.2 kg)  11/09/23 161 lb (73 kg)  05/18/23 167 lb (75.8 kg)      Physical Exam Constitutional:      General: She is not in acute distress.    Appearance: Normal appearance.  Cardiovascular:     Rate and Rhythm: Normal rate and regular rhythm.     Heart sounds: Murmur heard.  Pulmonary:     Effort: Pulmonary effort is normal. No respiratory distress.     Breath sounds: Normal breath sounds. No wheezing, rhonchi or rales.  Skin:    General: Skin is warm and dry.  Neurological:     General: No focal deficit present.     Mental Status: She is alert and oriented to person, place, and time.  Psychiatric:        Mood and Affect: Mood normal.        Behavior: Behavior normal.     Last metabolic panel Lab Results  Component Value Date   GLUCOSE 126 (H) 11/09/2023   NA 143 11/09/2023   K 3.7 11/09/2023   CL 104 11/09/2023    CO2 30 11/09/2023   BUN 14 11/09/2023   CREATININE 0.77 11/09/2023   EGFR 79 11/09/2023   CALCIUM  9.7 11/09/2023   PROT 7.7 11/09/2023   BILITOT 0.6 11/09/2023   AST 16 11/09/2023   ALT 14 11/09/2023   Last lipids Lab Results  Component Value Date   CHOL 215 (H) 11/09/2023   HDL 72 11/09/2023   LDLCALC 121 (H) 11/09/2023   TRIG 110 11/09/2023   CHOLHDL 3.0 11/09/2023   Last hemoglobin A1c Lab Results  Component Value Date   HGBA1C 6.8 (H) 11/09/2023          Assessment & Plan:   Assessment & Plan Dyslipidemia associated with type 2 diabetes mellitus (HCC) Pleasant 78 year old female seen in office today for medication refills. Dyslipidemia with associated type 2 DM. Dyslipidemia managed with Zetia  due to reported statin intolerance, statin myopathy. Discussed with patient that lipid panel is not at goal with elevated LDL when last checked 10/2023 at 121. She reports she is active and overall diet is well balanced. Proteins consisting of mainly baked or grilled chicken or salmon. She states after talking with  a friend she plans to stop using creamer in her coffee to see if this helps. Jardiance  on medication list, however she reports she never started this due to cost. A1c when last checked in 10/2023 was at goal resulting at 6.8  -Reviewed A1c goal of less than 7. Advising last A1c was at goal. -Encouraged continued lifestyle modifications focusing on dietary changes and continued regular exercise. -Refill Zetia  10mg  PO once daily  Orders:   HgB A1c   Comprehensive Metabolic Panel (CMET)   Urine Microalbumin w/creat. ratio   Lipid Profile   ezetimibe  (ZETIA ) 10 MG tablet; Take 1 tablet (10 mg total) by mouth daily.  Essential hypertension HTN uncontrolled; BP above goal. She has ran out of medication, which is likely the cause of current elevated blood pressure. We did discuss that her last BP when last seen in office in 10/2023 was also uncontrolled. Goal BP of less than  140/90. Patient's blood pressure is elevated as noted, however she is asymptomatic.   -Advised no changes to medication regimen at today's visit. -Refill Amlodipine  10mg  once daily -Refill Hydralazine  25mg  TID -Refill Valsartan  160mg  once daily -Update labs -Encouraged continued dietary changes and regular exercise -Close follow up in one month  Orders:   amLODipine  (NORVASC ) 10 MG tablet; Take 1 tablet (10 mg total) by mouth daily.   valsartan  (DIOVAN ) 160 MG tablet; Take 1 tablet (160 mg total) by mouth daily.   hydrALAZINE  (APRESOLINE ) 25 MG tablet; Take 1 tablet (25 mg total) by mouth 3 (three) times daily.  Statin myopathy Hx of statin intolerance/ statin myopathy. Continue Zetia  as prescribed, refill provided. Continue with dietary chagnes and regular exercise.   Orders:   ezetimibe  (ZETIA ) 10 MG tablet; Take 1 tablet (10 mg total) by mouth daily.  Murmur Murmur heard at time of exam. She denies known hx of murmur. Murmur soft. Will re-evaluate at time of 1 month f/u. If murmur still present will send for echo. She is receptive. Red flag symptoms reviewed. Return precautions advised. She voices understanding.         Return in about 1 month (around 11/06/2024).    Jeanne LOISE CORE, FNP "

## 2024-10-06 NOTE — Assessment & Plan Note (Addendum)
 HTN uncontrolled; BP above goal. She has ran out of medication, which is likely the cause of current elevated blood pressure. We did discuss that her last BP when last seen in office in 10/2023 was also uncontrolled. Goal BP of less than 140/90. Patient's blood pressure is elevated as noted, however she is asymptomatic.   -Advised no changes to medication regimen at today's visit. -Refill Amlodipine  10mg  once daily -Refill Hydralazine  25mg  TID -Refill Valsartan  160mg  once daily -Update labs -Encouraged continued dietary changes and regular exercise -Close follow up in one month  Orders:   amLODipine  (NORVASC ) 10 MG tablet; Take 1 tablet (10 mg total) by mouth daily.   valsartan  (DIOVAN ) 160 MG tablet; Take 1 tablet (160 mg total) by mouth daily.   hydrALAZINE  (APRESOLINE ) 25 MG tablet; Take 1 tablet (25 mg total) by mouth 3 (three) times daily.

## 2024-10-06 NOTE — Assessment & Plan Note (Addendum)
 Hx of statin intolerance/ statin myopathy. Continue Zetia  as prescribed, refill provided. Continue with dietary chagnes and regular exercise.   Orders:   ezetimibe  (ZETIA ) 10 MG tablet; Take 1 tablet (10 mg total) by mouth daily.

## 2024-10-07 LAB — HEMOGLOBIN A1C
Hgb A1c MFr Bld: 6.1 % — ABNORMAL HIGH
Mean Plasma Glucose: 128 mg/dL
eAG (mmol/L): 7.1 mmol/L

## 2024-10-07 LAB — LIPID PANEL
Cholesterol: 199 mg/dL
HDL: 63 mg/dL
LDL Cholesterol (Calc): 118 mg/dL — ABNORMAL HIGH
Non-HDL Cholesterol (Calc): 136 mg/dL — ABNORMAL HIGH
Total CHOL/HDL Ratio: 3.2 (calc)
Triglycerides: 79 mg/dL

## 2024-10-07 LAB — COMPREHENSIVE METABOLIC PANEL WITH GFR
AG Ratio: 1.4 (calc) (ref 1.0–2.5)
ALT: 16 U/L (ref 6–29)
AST: 18 U/L (ref 10–35)
Albumin: 4.3 g/dL (ref 3.6–5.1)
Alkaline phosphatase (APISO): 65 U/L (ref 37–153)
BUN: 23 mg/dL (ref 7–25)
CO2: 31 mmol/L (ref 20–32)
Calcium: 9.4 mg/dL (ref 8.6–10.4)
Chloride: 103 mmol/L (ref 98–110)
Creat: 0.73 mg/dL (ref 0.60–1.00)
Globulin: 3 g/dL (ref 1.9–3.7)
Glucose, Bld: 117 mg/dL — ABNORMAL HIGH (ref 65–99)
Potassium: 3.5 mmol/L (ref 3.5–5.3)
Sodium: 142 mmol/L (ref 135–146)
Total Bilirubin: 0.6 mg/dL (ref 0.2–1.2)
Total Protein: 7.3 g/dL (ref 6.1–8.1)
eGFR: 84 mL/min/1.73m2

## 2024-10-07 LAB — MICROALBUMIN / CREATININE URINE RATIO
Creatinine, Urine: 116 mg/dL (ref 20–275)
Microalb Creat Ratio: 816 mg/g{creat} — ABNORMAL HIGH
Microalb, Ur: 94.6 mg/dL

## 2024-10-09 ENCOUNTER — Ambulatory Visit: Payer: Self-pay | Admitting: Family Medicine

## 2024-11-06 ENCOUNTER — Ambulatory Visit: Admitting: Family Medicine
